# Patient Record
Sex: Female | Born: 1975 | Race: Black or African American | Hispanic: No | Marital: Single | Smoking: Current every day smoker
Health system: Southern US, Community
[De-identification: ages and names within clinical notes are randomized; demographics above are authoritative.]

## PROBLEM LIST (undated history)

## (undated) DIAGNOSIS — O009 Unspecified ectopic pregnancy without intrauterine pregnancy: Secondary | ICD-10-CM

## (undated) DIAGNOSIS — J40 Bronchitis, not specified as acute or chronic: Secondary | ICD-10-CM

## (undated) DIAGNOSIS — R011 Cardiac murmur, unspecified: Secondary | ICD-10-CM

## (undated) HISTORY — PX: OTHER SURGICAL HISTORY: SHX169

## (undated) HISTORY — PX: MOLE REMOVAL: SHX2046

---

## 1997-08-12 ENCOUNTER — Inpatient Hospital Stay (HOSPITAL_COMMUNITY): Admission: AD | Admit: 1997-08-12 | Discharge: 1997-08-12 | Payer: Self-pay | Admitting: Obstetrics

## 1997-08-16 ENCOUNTER — Ambulatory Visit (HOSPITAL_COMMUNITY): Admission: AD | Admit: 1997-08-16 | Discharge: 1997-08-16 | Payer: Self-pay | Admitting: Obstetrics

## 1997-08-19 ENCOUNTER — Ambulatory Visit (HOSPITAL_COMMUNITY): Admission: RE | Admit: 1997-08-19 | Discharge: 1997-08-19 | Payer: Self-pay | Admitting: Obstetrics

## 1997-09-29 ENCOUNTER — Inpatient Hospital Stay (HOSPITAL_COMMUNITY): Admission: AD | Admit: 1997-09-29 | Discharge: 1997-09-29 | Payer: Self-pay | Admitting: Obstetrics

## 1997-10-04 ENCOUNTER — Other Ambulatory Visit: Admission: RE | Admit: 1997-10-04 | Discharge: 1997-10-04 | Payer: Self-pay | Admitting: Obstetrics

## 1997-10-06 ENCOUNTER — Ambulatory Visit (HOSPITAL_COMMUNITY): Admission: RE | Admit: 1997-10-06 | Discharge: 1997-10-06 | Payer: Self-pay | Admitting: Obstetrics

## 1997-10-18 ENCOUNTER — Ambulatory Visit (HOSPITAL_COMMUNITY): Admission: RE | Admit: 1997-10-18 | Discharge: 1997-10-18 | Payer: Self-pay | Admitting: Obstetrics

## 1997-11-03 ENCOUNTER — Other Ambulatory Visit: Admission: RE | Admit: 1997-11-03 | Discharge: 1997-11-03 | Payer: Self-pay | Admitting: Obstetrics

## 1997-12-15 ENCOUNTER — Ambulatory Visit (HOSPITAL_COMMUNITY): Admission: RE | Admit: 1997-12-15 | Discharge: 1997-12-15 | Payer: Self-pay | Admitting: Obstetrics

## 1998-01-12 ENCOUNTER — Inpatient Hospital Stay (HOSPITAL_COMMUNITY): Admission: AD | Admit: 1998-01-12 | Discharge: 1998-01-12 | Payer: Self-pay | Admitting: Obstetrics

## 1998-02-25 ENCOUNTER — Inpatient Hospital Stay (HOSPITAL_COMMUNITY): Admission: AD | Admit: 1998-02-25 | Discharge: 1998-02-25 | Payer: Self-pay | Admitting: Obstetrics

## 1998-03-21 ENCOUNTER — Observation Stay (HOSPITAL_COMMUNITY): Admission: AD | Admit: 1998-03-21 | Discharge: 1998-03-22 | Payer: Self-pay | Admitting: Obstetrics

## 1998-03-25 ENCOUNTER — Encounter (HOSPITAL_COMMUNITY): Admission: RE | Admit: 1998-03-25 | Discharge: 1998-03-28 | Payer: Self-pay | Admitting: Obstetrics

## 1998-03-25 ENCOUNTER — Inpatient Hospital Stay (HOSPITAL_COMMUNITY): Admission: AD | Admit: 1998-03-25 | Discharge: 1998-03-25 | Payer: Self-pay | Admitting: Obstetrics

## 1998-03-26 ENCOUNTER — Inpatient Hospital Stay (HOSPITAL_COMMUNITY): Admission: AD | Admit: 1998-03-26 | Discharge: 1998-03-28 | Payer: Self-pay | Admitting: Obstetrics

## 1998-05-11 ENCOUNTER — Ambulatory Visit (HOSPITAL_COMMUNITY): Admission: RE | Admit: 1998-05-11 | Discharge: 1998-05-11 | Payer: Self-pay | Admitting: Obstetrics

## 1999-02-06 ENCOUNTER — Encounter: Payer: Self-pay | Admitting: Family Medicine

## 1999-02-06 ENCOUNTER — Emergency Department (HOSPITAL_COMMUNITY): Admission: EM | Admit: 1999-02-06 | Discharge: 1999-02-06 | Payer: Self-pay | Admitting: Emergency Medicine

## 1999-02-10 ENCOUNTER — Emergency Department (HOSPITAL_COMMUNITY): Admission: EM | Admit: 1999-02-10 | Discharge: 1999-02-10 | Payer: Self-pay | Admitting: Emergency Medicine

## 1999-05-02 ENCOUNTER — Emergency Department (HOSPITAL_COMMUNITY): Admission: EM | Admit: 1999-05-02 | Discharge: 1999-05-03 | Payer: Self-pay | Admitting: Emergency Medicine

## 1999-08-07 ENCOUNTER — Inpatient Hospital Stay (HOSPITAL_COMMUNITY): Admission: EM | Admit: 1999-08-07 | Discharge: 1999-08-07 | Payer: Self-pay | Admitting: Obstetrics

## 1999-08-11 ENCOUNTER — Inpatient Hospital Stay (HOSPITAL_COMMUNITY): Admission: AD | Admit: 1999-08-11 | Discharge: 1999-08-13 | Payer: Self-pay | Admitting: Obstetrics

## 1999-08-11 ENCOUNTER — Encounter: Payer: Self-pay | Admitting: Obstetrics

## 1999-08-11 ENCOUNTER — Encounter (INDEPENDENT_AMBULATORY_CARE_PROVIDER_SITE_OTHER): Payer: Self-pay

## 1999-09-06 ENCOUNTER — Other Ambulatory Visit: Admission: RE | Admit: 1999-09-06 | Discharge: 1999-09-06 | Payer: Self-pay | Admitting: Obstetrics

## 1999-10-18 ENCOUNTER — Inpatient Hospital Stay (HOSPITAL_COMMUNITY): Admission: AD | Admit: 1999-10-18 | Discharge: 1999-10-18 | Payer: Self-pay | Admitting: Obstetrics

## 1999-11-11 ENCOUNTER — Inpatient Hospital Stay (HOSPITAL_COMMUNITY): Admission: AD | Admit: 1999-11-11 | Discharge: 1999-11-11 | Payer: Self-pay | Admitting: Obstetrics

## 2000-01-16 ENCOUNTER — Inpatient Hospital Stay (HOSPITAL_COMMUNITY): Admission: AD | Admit: 2000-01-16 | Discharge: 2000-01-16 | Payer: Self-pay | Admitting: Obstetrics

## 2001-02-09 ENCOUNTER — Encounter: Payer: Self-pay | Admitting: Emergency Medicine

## 2001-02-09 ENCOUNTER — Emergency Department (HOSPITAL_COMMUNITY): Admission: EM | Admit: 2001-02-09 | Discharge: 2001-02-09 | Payer: Self-pay | Admitting: Emergency Medicine

## 2001-02-21 ENCOUNTER — Emergency Department (HOSPITAL_COMMUNITY): Admission: EM | Admit: 2001-02-21 | Discharge: 2001-02-22 | Payer: Self-pay | Admitting: Emergency Medicine

## 2001-08-11 ENCOUNTER — Inpatient Hospital Stay (HOSPITAL_COMMUNITY): Admission: AD | Admit: 2001-08-11 | Discharge: 2001-08-11 | Payer: Self-pay | Admitting: Obstetrics

## 2001-09-26 ENCOUNTER — Encounter: Payer: Self-pay | Admitting: Emergency Medicine

## 2001-09-26 ENCOUNTER — Emergency Department (HOSPITAL_COMMUNITY): Admission: EM | Admit: 2001-09-26 | Discharge: 2001-09-26 | Payer: Self-pay | Admitting: Emergency Medicine

## 2001-10-30 ENCOUNTER — Other Ambulatory Visit: Admission: RE | Admit: 2001-10-30 | Discharge: 2001-10-30 | Payer: Self-pay | Admitting: Obstetrics and Gynecology

## 2002-07-04 ENCOUNTER — Emergency Department (HOSPITAL_COMMUNITY): Admission: EM | Admit: 2002-07-04 | Discharge: 2002-07-04 | Payer: Self-pay | Admitting: *Deleted

## 2002-07-05 ENCOUNTER — Emergency Department (HOSPITAL_COMMUNITY): Admission: EM | Admit: 2002-07-05 | Discharge: 2002-07-05 | Payer: Self-pay | Admitting: Emergency Medicine

## 2003-05-11 ENCOUNTER — Emergency Department (HOSPITAL_COMMUNITY): Admission: EM | Admit: 2003-05-11 | Discharge: 2003-05-11 | Payer: Self-pay | Admitting: *Deleted

## 2005-09-14 ENCOUNTER — Emergency Department (HOSPITAL_COMMUNITY): Admission: EM | Admit: 2005-09-14 | Discharge: 2005-09-14 | Payer: Self-pay | Admitting: Emergency Medicine

## 2005-09-26 ENCOUNTER — Other Ambulatory Visit (HOSPITAL_COMMUNITY): Admission: RE | Admit: 2005-09-26 | Discharge: 2005-10-26 | Payer: Self-pay | Admitting: Psychiatry

## 2005-09-26 ENCOUNTER — Ambulatory Visit: Payer: Self-pay | Admitting: Psychiatry

## 2005-11-13 ENCOUNTER — Emergency Department (HOSPITAL_COMMUNITY): Admission: EM | Admit: 2005-11-13 | Discharge: 2005-11-13 | Payer: Self-pay | Admitting: Emergency Medicine

## 2006-02-18 ENCOUNTER — Inpatient Hospital Stay (HOSPITAL_COMMUNITY): Admission: AD | Admit: 2006-02-18 | Discharge: 2006-02-18 | Payer: Self-pay | Admitting: Obstetrics

## 2006-05-16 ENCOUNTER — Emergency Department (HOSPITAL_COMMUNITY): Admission: EM | Admit: 2006-05-16 | Discharge: 2006-05-16 | Payer: Self-pay | Admitting: Family Medicine

## 2007-12-15 ENCOUNTER — Emergency Department (HOSPITAL_COMMUNITY): Admission: EM | Admit: 2007-12-15 | Discharge: 2007-12-15 | Payer: Self-pay | Admitting: Family Medicine

## 2007-12-16 ENCOUNTER — Emergency Department (HOSPITAL_COMMUNITY): Admission: EM | Admit: 2007-12-16 | Discharge: 2007-12-17 | Payer: Self-pay | Admitting: Emergency Medicine

## 2008-08-02 ENCOUNTER — Emergency Department (HOSPITAL_COMMUNITY): Admission: EM | Admit: 2008-08-02 | Discharge: 2008-08-02 | Payer: Self-pay | Admitting: Emergency Medicine

## 2009-01-28 ENCOUNTER — Emergency Department (HOSPITAL_COMMUNITY): Admission: EM | Admit: 2009-01-28 | Discharge: 2009-01-28 | Payer: Self-pay | Admitting: Emergency Medicine

## 2009-02-23 ENCOUNTER — Emergency Department (HOSPITAL_COMMUNITY): Admission: EM | Admit: 2009-02-23 | Discharge: 2009-02-23 | Payer: Self-pay | Admitting: Family Medicine

## 2009-04-06 ENCOUNTER — Emergency Department (HOSPITAL_COMMUNITY): Admission: EM | Admit: 2009-04-06 | Discharge: 2009-04-06 | Payer: Self-pay | Admitting: Family Medicine

## 2010-08-09 LAB — POCT PREGNANCY, URINE: Preg Test, Ur: NEGATIVE

## 2010-09-15 NOTE — Discharge Summary (Signed)
[facility]  Patient:    Kristina Calhoun, Kristina Calhoun                     MRN: [id number] Adm. Date:  [id number] Disc. Date: [id number] Attending:  Venita Sheffield CC:         Kathreen Cosier, M.D.   Discharge Summary  ADMISSION DIAGNOSES:          1. Ruptured right tubal ectopic pregnancy.                               2. Hemoperitoneum.  DISCHARGE DIAGNOSIS:          Status post right salpingectomy for right                               tubal ectopic pregnancy.  CONDITION ON DISCHARGE:       Improved and discharged home in good condition.  REASON FOR ADMISSION:         A 35 year old G52, P4 black female with last menstrual period of June 20, 1999 and estimated gestational age of seven weeks.  Contraception is tubal ligation in January of 2000.  She presented to triage with severe right lower abdominal pain. The patient was seen in triage on August 08, 1999, with a three-day history of right lower abdominal pain and had a positive pregnancy test at that time.  She also had a urinary tract infection and was treated for the urinary tract infection and discharged home with ectopic precautions to follow up with Kathreen Cosier, M.D., in the office as soon as possible.  She did not follow instructions and now presents with severe right lower quadrant pain and ultrasound was done which revealed right tubal ectopic pregnancy with cardiac activity, large hemoperitoneum.  PAST SURGICAL HISTORY:        Tubal ligation in January of 2000.  PAST MEDICAL HISTORY:         None.  MEDICATIONS:                  Macrobid for urinary tract infection.  ALLERGIES:                    SULFA.  SOCIAL HISTORY:               Single, smokes six cigarettes per day.  Negative for alcohol or recreational drug use.  FAMILY HISTORY:               Noncontributory.  REVIEW OF SYSTEMS:            Positive per history of present illness, otherwise  noncontributory.  PHYSICAL EXAMINATION:  GENERAL APPEARANCE:           A well-nourished, well-developed black female in no acute distress.  VITAL SIGNS:                  Temperature 99, pulse 72, respiratory rate 22, blood pressure 111/51.  HEENT:                        Normal.  LUNGS:                        Clear to auscultation bilaterally.  CARDIOVASCULAR:  Regular rate and rhythm without murmurs, rubs, or gallops.  ABDOMEN:                      Soft, tender in the right lower quadrant.  No masses or organomegaly appreciated.  PELVIC:                       Uterus slightly enlarged, mid position, soft, nontender.  Right adnexal fullness appreciated with tenderness to deep palpation.  EXTREMITIES:                  There is no clubbing, cyanosis, or edema.  LABORATORY VALUES:            Hemoglobin 10.  Quantitative Beta HCG of 17,293.  IMPRESSION:                   Right tubal ectopic pregnancy.  PLAN:                         Surgical removal of right tubal ectopic pregnancy.  HOSPITAL COURSE:              The patient underwent a right salpingectomy on August 12, 1999, for a ruptured right tubal ectopic pregnancy without complications.  Postoperative course was uncomplicated and she was discharged home on postoperative day #2 in good condition.  DISCHARGE LABORATORY VALUES:  Hemoglobin 9.3, hematocrit 27.9, white blood cell count 8400, platelets 223,000.  Blood type was B positive.  MEDICATIONS:                  1. Percocet one to two tablets p.o. every                                  six hours as needed for pain.                               2. Ferrous sulfate 325 mg p.o. four times a                                  day.  DISPOSITION:                  Activity, diet and wound care instructions were given per written booklet.  FOLLOW-UP:                    The patient is to call Dr. Tawny Hopping office for a follow-up appointment in four to six  weeks. DD:  02/06/00 TD:  02/07/00 Job: [id number]

## 2010-09-15 NOTE — Op Note (Signed)
[facility]  Patient:    Kristina Calhoun, Kristina Calhoun                     MRN: [id number] Proc. Date: 08/12/99 Adm. Date:  [id number] Attending:  Tammi Sou CC:         Kathreen Cosier, M.D.                           Operative Report  PREOPERATIVE DIAGNOSES:       Ruptured right tubal ectopic pregnancy, positive cardiac activity, large hemoperitoneum.  POSTOPERATIVE DIAGNOSES:      Ruptured right tubal ectopic pregnancy, positive cardiac activity, large hemoperitoneum.  PROCEDURE:                    Right salpingectomy.  SURGEON:                      Charles A. Clearance Coots, M.D.  SURGEON:                      Charles A. Clearance Coots, M.D.  ASSISTANT:                    Placido Sou, R.N.  ANESTHESIA:                   General.  ESTIMATED BLOOD LOSS:         200 ml with hemoperitoneum and clot.  COMPLICATIONS:                None.  DRAINS:                       Foley to gravity.  SPECIMEN:                     Right fallopian tube with ectopic pregnancy.  DESCRIPTION OF PROCEDURE:     The patient was brought to the operating room and  after satisfactory general endotracheal intubation, the abdomen was prepped and  draped in the usual sterile fashion in a supine position.  A small Pfannenstiel  incision was made with the scalpel that was deepened down to the fascia with the scalpel.  Fascia was nicked in the midline and the fascial incision was extended to the left and to the right with curved Mayo scissors.  The superior and inferior  fascial edges were taken off of the rectus muscle with both blunt and sharp dissection.  The rectus muscle was bluntly and sharply divided in the midline.  Peritoneum was entered digitally and was digitally extended to the left and to he right.  A large amount of hemoperitoneum was noted and this was suctioned for better visibility of the uterus and the ectopic pregnancy.  The right adnexal area was then  grasped with [facility] forcep and the uterus was elevated.  The right fallopian tube was grasped with a Babcock clamp and was followed serially with  Babcock clamp down to the ectopic pregnancy.  Kelly forceps were then placed across the proximal end of the fallopian tube, extending beneath the ectopic pregnancy  with a double-clamp technique from the proximal end of the tube and the distal nd of the tube, with the two Kelly forceps abutting each other beneath the ectopic  pregnancy.  The right fallopian tube was then excised with Metzenbaum scissors nd the stumps were suture-ligated with  transfixion sutures of 2-0 Monocryl.  A free tie of 2-0 Monocryl was then placed below the knot.  Hemostasis was excellent and the proximal tubal stump was placed back in its normal anatomic position. Pelvic cavity was thoroughly irrigated with warm saline solution and all clots were removed.  The abdomen was then closed as follows:  The fascia was closed with a  continuous suture of 2-0 Vicryl, subcutaneous tissue was thoroughly irrigated with warm saline solution and all areas of subcutaneous bleeding were coagulated with the Bovie.  The skin was then approximated with a subcuticular suture of 3-0 Monocryl.  A sterile bandage was applied to the incision closure.  Surgical technician indicated that all needle, sponge and instrument counts were correct. The patient tolerated the procedure well and was transported to the recovery room in satisfactory condition.DD:  08/12/99 TD:  08/12/99 Job: [id number]

## 2011-10-28 ENCOUNTER — Encounter (HOSPITAL_COMMUNITY): Payer: Self-pay | Admitting: Emergency Medicine

## 2011-10-28 ENCOUNTER — Emergency Department (HOSPITAL_COMMUNITY): Payer: Self-pay

## 2011-10-28 ENCOUNTER — Emergency Department (HOSPITAL_COMMUNITY)
Admission: EM | Admit: 2011-10-28 | Discharge: 2011-10-28 | Disposition: A | Discharge status: Home / Self Care | Payer: Self-pay | Attending: Emergency Medicine | Admitting: Emergency Medicine

## 2011-10-28 DIAGNOSIS — J40 Bronchitis, not specified as acute or chronic: Secondary | ICD-10-CM | POA: Insufficient documentation

## 2011-10-28 DIAGNOSIS — Z79899 Other long term (current) drug therapy: Secondary | ICD-10-CM | POA: Insufficient documentation

## 2011-10-28 DIAGNOSIS — Z87891 Personal history of nicotine dependence: Secondary | ICD-10-CM | POA: Insufficient documentation

## 2011-10-28 DIAGNOSIS — Z7982 Long term (current) use of aspirin: Secondary | ICD-10-CM | POA: Insufficient documentation

## 2011-10-28 HISTORY — DX: Bronchitis, not specified as acute or chronic: J40

## 2011-10-28 HISTORY — DX: Unspecified ectopic pregnancy without intrauterine pregnancy: O00.90

## 2011-10-28 MED ORDER — IPRATROPIUM BROMIDE 0.02 % IN SOLN
0.5000 mg | Freq: Once | RESPIRATORY_TRACT | Status: AC
  Administered 2011-10-28: 0.5 mg via RESPIRATORY_TRACT
  Filled 2011-10-28: qty 2.5

## 2011-10-28 MED ORDER — PREDNISONE 20 MG PO TABS
ORAL_TABLET | ORAL | Status: AC
  Administered 2011-10-28: 60 mg
  Filled 2011-10-28: qty 3

## 2011-10-28 MED ORDER — PREDNISONE 20 MG PO TABS
ORAL_TABLET | ORAL | Status: AC
  Filled 2011-10-28: qty 3

## 2011-10-28 MED ORDER — ALBUTEROL SULFATE (5 MG/ML) 0.5% IN NEBU
5.0000 mg | INHALATION_SOLUTION | Freq: Once | RESPIRATORY_TRACT | Status: AC
  Administered 2011-10-28: 5 mg via RESPIRATORY_TRACT
  Filled 2011-10-28: qty 1

## 2011-10-28 MED ORDER — ONDANSETRON 4 MG PO TBDP: 8.0000 mg | ORAL_TABLET | Freq: Once | ORAL | Status: DC

## 2011-10-28 MED ORDER — ONDANSETRON HCL 8 MG PO TABS
ORAL_TABLET | ORAL | Status: AC
  Administered 2011-10-28: 8 mg
  Filled 2011-10-28: qty 2

## 2011-10-28 MED ORDER — PREDNISONE 50 MG PO TABS: ORAL_TABLET | ORAL | Status: DC

## 2011-10-28 MED ORDER — HYDROCOD POLST-CHLORPHEN POLST 10-8 MG/5ML PO LQCR: 5.0000 mL | Freq: Two times a day (BID) | ORAL | Status: DC

## 2011-10-28 MED ORDER — PREDNISONE 20 MG PO TABS
60.0000 mg | ORAL_TABLET | Freq: Once | ORAL | Status: AC
  Administered 2011-10-28: 60 mg via ORAL
  Filled 2011-10-28: qty 3

## 2011-10-28 MED ORDER — ALBUTEROL SULFATE HFA 108 (90 BASE) MCG/ACT IN AERS: 1.0000 | INHALATION_SPRAY | Freq: Four times a day (QID) | RESPIRATORY_TRACT | Status: DC | PRN

## 2011-10-28 NOTE — Discharge Instructions (Signed)
Take prednisone every morning for the next 5 days using albuterol inhaler as needed for cough. Use cough syrupr for additional cough relief but do not drive or operate machinery with use. Followup with your primary care provider or the urgent care in the next few days for recheck of ongoing symptoms however return to the emergency room at any time for emergent changing or worsening symptoms.  Bronchitis Bronchitis is the body's way of reacting to injury and/or infection (inflammation) of the bronchi. Bronchi are the air tubes that extend from the windpipe into the lungs. If the inflammation becomes severe, it may cause shortness of breath. CAUSES  Inflammation may be caused by:  A virus.   Germs (bacteria).   Dust.   Allergens.   Pollutants and many other irritants.  The cells lining the bronchial tree are covered with tiny hairs (cilia). These constantly beat upward, away from the lungs, toward the mouth. This keeps the lungs free of pollutants. When these cells become too irritated and are unable to do their job, mucus begins to develop. This causes the characteristic cough of bronchitis. The cough clears the lungs when the cilia are unable to do their job. Without either of these protective mechanisms, the mucus would settle in the lungs. Then you would develop pneumonia. Smoking is a common cause of bronchitis and can contribute to pneumonia. Stopping this habit is the single most important thing you can do to help yourself. TREATMENT   Your caregiver may prescribe an antibiotic if the cough is caused by bacteria. Also, medicines that open up your airways make it easier to breathe. Your caregiver may also recommend or prescribe an expectorant. It will loosen the mucus to be coughed up. Only take over-the-counter or prescription medicines for pain, discomfort, or fever as directed by your caregiver.   Removing whatever causes the problem (smoking, for example) is critical to preventing the  problem from getting worse.   Cough suppressants may be prescribed for relief of cough symptoms.   Inhaled medicines may be prescribed to help with symptoms now and to help prevent problems from returning.   For those with recurrent (chronic) bronchitis, there may be a need for steroid medicines.  SEEK IMMEDIATE MEDICAL CARE IF:   During treatment, you develop more pus-like mucus (purulent sputum).   You have a fever.   Your baby is older than 3 months with a rectal temperature of 102 F (38.9 C) or higher.   Your baby is 66 months old or younger with a rectal temperature of 100.4 F (38 C) or higher.   You become progressively more ill.   You have increased difficulty breathing, wheezing, or shortness of breath.  It is necessary to seek immediate medical care if you are elderly or sick from any other disease. MAKE SURE YOU:   Understand these instructions.   Will watch your condition.   Will get help right away if you are not doing well or get worse.  Document Released: 04/16/2005 Document Revised: 04/05/2011 Document Reviewed: 02/24/2008 [facility] Patient Information 2012 [place], [place].

## 2011-10-28 NOTE — ED Provider Notes (Signed)
Medical screening examination/treatment/procedure(s) were performed by non-physician practitioner and as supervising physician I was immediately available for consultation/collaboration.   Glynn Octave, MD 10/28/11 [phone]

## 2011-10-28 NOTE — ED Notes (Signed)
Pt given Prednisone again once nausea subsided.  Tolerated well.  Pt d/c'd.

## 2011-10-28 NOTE — ED Notes (Signed)
Pt reports head cold with productive cough onset Thursday. Pt reports cough no longer productive. Pt noted to have dry cough in triage. Pt c/o shortness of breath and fatigue. Pt denies fever.

## 2011-10-28 NOTE — ED Provider Notes (Signed)
History     CSN: [id number]  Arrival date & time 10/28/11  8295   First MD Initiated Contact with Patient 10/28/11 1018      Chief Complaint  Patient presents with  . Cough    (Consider location/radiation/quality/duration/timing/severity/associated sxs/prior treatment) HPI  Patient presents to emergency department complaining of bronchitis. Patient states she has history of recurrent bronchitis stating that she stopped using traditional cigarettes 6 months ago and over the last 6 months has been using an electronic cigarettes. Patient states that over the last 4 days she's had cough and cold like symptoms with a productive cough for the first few days that has now become nonproductive cough. Patient states that she also had nasal congestion and some drainage that have improved however ongoing cough. She denies any fevers, chills, chest pain, hemoptysis, abdominal pain, nausea, vomiting, diarrhea, sore throat, difficulty swallowing, or difficulty breathing. Patient states her cough is consistent with her history of recurrent bronchitis. Patient taken nothing for cough prior to arrival. Patient denies aggravating or alleviating factors.  Past Medical History  Diagnosis Date  . Bronchitis   . Ectopic pregnancy     Past Surgical History  Procedure Date  . Etopic pregnancy ectopic surgery    History reviewed. No pertinent family history.  History  Substance Use Topics  . Smoking status: Former Smoker    Types: Cigarettes    Quit date: 08/28/2011  . Smokeless tobacco: Not on file   Comment: uses E cigarettes  . Alcohol Use: No    OB History    Grav Para Term Preterm Abortions TAB SAB Ect Mult Living                  Review of Systems  All other systems reviewed and are negative.    Allergies  Sulfa antibiotics  Home Medications   Current Outpatient Rx  Name Route Sig Dispense Refill  . ASPIRIN-ACETAMINOPHEN-CAFFEINE 250-250-65 MG PO TABS Oral Take 2 tablets by  mouth every 6 (six) hours as needed. For pain/headache    . ALBUTEROL SULFATE HFA 108 (90 BASE) MCG/ACT IN AERS Inhalation Inhale 1-2 puffs into the lungs every 6 (six) hours as needed for wheezing. 1 Inhaler 0  . HYDROCOD POLST-CPM POLST ER 10-8 MG/5ML PO LQCR Oral Take 5 mLs by mouth every 12 (twelve) hours. 115 mL 0  . PREDNISONE 50 MG PO TABS  Take 1 tablet by mouth daily for total of 5 days. 5 tablet 0    BP 127/78  Pulse 103  Temp 98.7 F (37.1 C) (Oral)  Resp 19  SpO2 97%  LMP 10/08/2011  Physical Exam  Vitals reviewed. Constitutional: She is oriented to person, place, and time. She appears well-developed and well-nourished. No distress.  HENT:  Head: Normocephalic and atraumatic.  Right Ear: External ear normal.  Left Ear: External ear normal.  Nose: Nose normal.  Mouth/Throat: No oropharyngeal exudate.       Mild erythema of posterior pharynx and tonsils no tonsillar exudate or enlargement. Patent airway. Swallowing secretions well  Eyes: Conjunctivae are normal.  Neck: Normal range of motion. Neck supple.  Cardiovascular: Normal rate, regular rhythm and normal heart sounds.  Exam reveals no gallop and no friction rub.   No murmur heard. Pulmonary/Chest: Effort normal. No respiratory distress. She has wheezes. She has no rales. She exhibits no tenderness.       Faint exp wheezing bilaterally with good air movement throughout lung fields.   Abdominal: Soft. Bowel sounds are  normal. She exhibits no distension and no mass. There is no tenderness. There is no rebound and no guarding.  Lymphadenopathy:    She has no cervical adenopathy.  Neurological: She is alert and oriented to person, place, and time. She has normal reflexes.  Skin: Skin is warm and dry. No rash noted. She is not diaphoretic.  Psychiatric: She has a normal mood and affect.    ED Course  Procedures (including critical care time)  Neb albuterol/atrovent. PO prednisone.   Labs Reviewed - No data to  display Dg Chest 2 View  10/28/2011  *RADIOLOGY REPORT*  Clinical Data: Cough  CHEST - 2 VIEW  Comparison: 08/02/2008  Findings: The heart size and mediastinal contours are within normal limits. Chronic bronchitic changes appears similar to previous exam.  Both lungs are clear.  The visualized skeletal structures are unremarkable.  IMPRESSION: No acute cardiopulmonary abnormalities.  Original Report Authenticated By: Rosealee Albee, M.D.     1. Bronchitis       MDM  Patient is afebrile and nontoxic-appearing with no respiratory distress. No acute findings on chest x-ray. Patient's current symptoms are consistent with her prior bronchitis. Will treat with as needed albuterol inhaler and steroids. Gave patient referral to primary care.        [place], [place] 10/28/11 1027

## 2011-10-28 NOTE — ED Notes (Signed)
Pt vomiting at the time. Given medication, will attempt to take PO steroids after nausea stopped.

## 2011-11-08 ENCOUNTER — Emergency Department (HOSPITAL_COMMUNITY)
Admission: EM | Admit: 2011-11-08 | Discharge: 2011-11-08 | Disposition: A | Discharge status: Home / Self Care | Payer: Self-pay | Attending: Emergency Medicine | Admitting: Emergency Medicine

## 2011-11-08 ENCOUNTER — Encounter (HOSPITAL_COMMUNITY): Payer: Self-pay | Admitting: Emergency Medicine

## 2011-11-08 DIAGNOSIS — J040 Acute laryngitis: Secondary | ICD-10-CM | POA: Insufficient documentation

## 2011-11-08 DIAGNOSIS — R0602 Shortness of breath: Secondary | ICD-10-CM | POA: Insufficient documentation

## 2011-11-08 DIAGNOSIS — R0789 Other chest pain: Secondary | ICD-10-CM | POA: Insufficient documentation

## 2011-11-08 DIAGNOSIS — H9209 Otalgia, unspecified ear: Secondary | ICD-10-CM | POA: Insufficient documentation

## 2011-11-08 DIAGNOSIS — J3489 Other specified disorders of nose and nasal sinuses: Secondary | ICD-10-CM | POA: Insufficient documentation

## 2011-11-08 DIAGNOSIS — R509 Fever, unspecified: Secondary | ICD-10-CM | POA: Insufficient documentation

## 2011-11-08 MED ORDER — DEXAMETHASONE SODIUM PHOSPHATE 10 MG/ML IJ SOLN: 8.0000 mg | Freq: Once | INTRAMUSCULAR | Status: DC

## 2011-11-08 MED ORDER — GUAIFENESIN ER 1200 MG PO TB12: 1.0000 | ORAL_TABLET | Freq: Two times a day (BID) | ORAL | Status: DC

## 2011-11-08 MED ORDER — DEXAMETHASONE SODIUM PHOSPHATE 4 MG/ML IJ SOLN
INTRAMUSCULAR | Status: AC
  Administered 2011-11-08: 8 mg
  Filled 2011-11-08: qty 2

## 2011-11-08 MED ORDER — PREDNISONE 50 MG PO TABS: 50.0000 mg | ORAL_TABLET | Freq: Every day | ORAL | Status: AC

## 2011-11-08 MED ORDER — DEXAMETHASONE SODIUM PHOSPHATE 10 MG/ML IJ SOLN: 10.0000 mg | Freq: Once | INTRAMUSCULAR | Status: DC

## 2011-11-08 NOTE — ED Provider Notes (Signed)
History     CSN: [id number]  Arrival date & time 11/08/11  2029   First MD Initiated Contact with Patient 11/08/11 2052      Chief Complaint  Patient presents with  . Laryngitis    (Consider location/radiation/quality/duration/timing/severity/associated sxs/prior treatment) HPI Patient presents emergency Dept. with hoarseness of her voice that began 2 days ago.  Patient, states last week she was dealing with bronchitis.  Patient has fevers, chest pain, shortness of breath, sore throat, nasal congestion, ear pain nausea, vomiting or weakness. The patient states that her voice get worse as the day goes on. Past Medical History  Diagnosis Date  . Bronchitis   . Ectopic pregnancy     Past Surgical History  Procedure Date  . Etopic pregnancy ectopic surgery    History reviewed. No pertinent family history.  History  Substance Use Topics  . Smoking status: Former Smoker    Types: Cigarettes    Quit date: 08/28/2011  . Smokeless tobacco: Not on file   Comment: uses E cigarettes  . Alcohol Use: No    OB History    Grav Para Term Preterm Abortions TAB SAB Ect Mult Living                  Review of Systems All other systems negative except as documented in the HPI. All pertinent positives and negatives as reviewed in the HPI.  Allergies  Sulfa antibiotics  Home Medications   Current Outpatient Rx  Name Route Sig Dispense Refill  . ALBUTEROL SULFATE HFA 108 (90 BASE) MCG/ACT IN AERS Inhalation Inhale 1-2 puffs into the lungs every 6 (six) hours as needed for wheezing. 1 Inhaler 0  . HYDROCOD POLST-CPM POLST ER 10-8 MG/5ML PO LQCR Oral Take 5 mLs by mouth daily as needed. For cough/pain      BP 153/88  Temp 98.3 F (36.8 C) (Oral)  Resp 18  SpO2 98%  LMP 11/05/2011  Physical Exam  Nursing note and vitals reviewed. Constitutional: She appears well-developed and well-nourished.  HENT:  Head: Normocephalic and atraumatic.  Mouth/Throat: Oropharynx is clear and  moist. No oropharyngeal exudate.  Eyes: Pupils are equal, round, and reactive to light.  Cardiovascular: Normal rate and regular rhythm.   Pulmonary/Chest: Effort normal and breath sounds normal.  Skin: Skin is warm and dry. No rash noted.    ED Course  Procedures (including critical care time)   The patient will be treated for laryngitis. The patient is advised to return here as needed. Follow up with your doctor.  MDM          Carlyle Dolly, PA-C 11/08/11 2133

## 2011-11-08 NOTE — ED Provider Notes (Signed)
Medical screening examination/treatment/procedure(s) were performed by non-physician practitioner and as supervising physician I was immediately available for consultation/collaboration.   Dione Booze, MD 11/08/11 [phone]

## 2011-11-08 NOTE — ED Notes (Signed)
Patient seen last week with bronchitis, now with scratchy throat and loosing voice.

## 2012-05-04 ENCOUNTER — Emergency Department (HOSPITAL_COMMUNITY): Payer: BC Managed Care – PPO

## 2012-05-04 ENCOUNTER — Emergency Department (HOSPITAL_COMMUNITY)
Admission: EM | Admit: 2012-05-04 | Discharge: 2012-05-04 | Disposition: A | Discharge status: Home / Self Care | Payer: BC Managed Care – PPO | Attending: Emergency Medicine | Admitting: Emergency Medicine

## 2012-05-04 ENCOUNTER — Encounter (HOSPITAL_COMMUNITY): Payer: Self-pay | Admitting: Emergency Medicine

## 2012-05-04 DIAGNOSIS — Z79899 Other long term (current) drug therapy: Secondary | ICD-10-CM | POA: Insufficient documentation

## 2012-05-04 DIAGNOSIS — Z87891 Personal history of nicotine dependence: Secondary | ICD-10-CM | POA: Insufficient documentation

## 2012-05-04 DIAGNOSIS — J40 Bronchitis, not specified as acute or chronic: Secondary | ICD-10-CM | POA: Insufficient documentation

## 2012-05-04 MED ORDER — PREDNISONE 10 MG PO TABS: 20.0000 mg | ORAL_TABLET | Freq: Every day | ORAL | Status: DC

## 2012-05-04 MED ORDER — HYDROCOD POLST-CHLORPHEN POLST 10-8 MG/5ML PO LQCR: 5.0000 mL | Freq: Two times a day (BID) | ORAL | Status: DC

## 2012-05-04 MED ORDER — PREDNISONE 20 MG PO TABS
60.0000 mg | ORAL_TABLET | Freq: Once | ORAL | Status: AC
  Administered 2012-05-04: 60 mg via ORAL
  Filled 2012-05-04: qty 3

## 2012-05-04 NOTE — ED Notes (Addendum)
C/o sore throat, non-productive cough, and nausea x 3-4 days.

## 2012-05-04 NOTE — ED Provider Notes (Signed)
History     CSN: [id number]  Arrival date & time 05/04/12  2123   First MD Initiated Contact with Patient 05/04/12 2133      Chief Complaint  Patient presents with  . Cough    (Consider location/radiation/quality/duration/timing/severity/associated sxs/prior treatment) HPI Comments: Patient with Hx bronchitis has had nonproductive cough for the past 4 days She has been using inhaler with no relief than she started taking Muconex with no relief.  Denies fever, smoking N/V/D Has no PCP   Patient is a 37 y.o. female presenting with cough. The history is provided by the patient.  Cough This is a new problem. The current episode started more than 2 days ago. The problem occurs every few minutes. The cough is non-productive. Pertinent negatives include no chills, no rhinorrhea, no sore throat, no myalgias, no shortness of breath and no wheezing.    Past Medical History  Diagnosis Date  . Bronchitis   . Ectopic pregnancy     Past Surgical History  Procedure Date  . Etopic pregnancy ectopic surgery    No family history on file.  History  Substance Use Topics  . Smoking status: Former Smoker    Types: Cigarettes    Quit date: 08/28/2011  . Smokeless tobacco: Not on file     Comment: uses E cigarettes  . Alcohol Use: No    OB History    Grav Para Term Preterm Abortions TAB SAB Ect Mult Living                  Review of Systems  Constitutional: Negative for fever and chills.  HENT: Negative for congestion, sore throat and rhinorrhea.   Respiratory: Positive for cough. Negative for shortness of breath and wheezing.   Gastrointestinal: Negative for nausea and vomiting.  Musculoskeletal: Negative for myalgias.  Skin: Negative for rash and wound.    Allergies  Sulfa antibiotics  Home Medications   Current Outpatient Rx  Name  Route  Sig  Dispense  Refill  . ALBUTEROL SULFATE HFA 108 (90 BASE) MCG/ACT IN AERS   Inhalation   Inhale 1-2 puffs into the lungs every 6  (six) hours as needed for wheezing.   1 Inhaler   0   . HYDROCOD POLST-CPM POLST ER 10-8 MG/5ML PO LQCR   Oral   Take 5 mLs by mouth every 12 (twelve) hours.   140 mL   0   . PREDNISONE 10 MG PO TABS   Oral   Take 2 tablets (20 mg total) by mouth daily.   14 tablet   0     BP 141/68  Temp 99.8 F (37.7 C) (Oral)  Resp 24  SpO2 98%  LMP 04/13/2012  Physical Exam  Constitutional: She appears well-developed and well-nourished.  HENT:  Head: Normocephalic.  Right Ear: External ear normal.  Left Ear: External ear normal.  Mouth/Throat: Oropharynx is clear and moist.  Eyes: Pupils are equal, round, and reactive to light.  Neck: Normal range of motion.  Cardiovascular: Normal rate.   Pulmonary/Chest: Effort normal and breath sounds normal. No respiratory distress. She has no wheezes.       Frequent coughing  Musculoskeletal: Normal range of motion.  Neurological: She is alert.  Skin: Skin is warm.    ED Course  Procedures (including critical care time)  Labs Reviewed - No data to display Dg Chest 2 View  05/04/2012  *RADIOLOGY REPORT*  Clinical Data: Cough with mild fever  CHEST - 2 VIEW  Comparison: 10/28/2011.  Findings: Normal heart size and clear lung fields.  No bony abnormality. No hilar or mediastinal contour abnormalities.  No change from priors.  IMPRESSION: Negative exam.   Original Report Authenticated By: Davonna Belling, M.D.      1. Bronchitis       MDM          Arman Filter, NP 05/05/12 [phone]

## 2012-05-07 NOTE — ED Provider Notes (Signed)
Medical screening examination/treatment/procedure(s) were performed by non-physician practitioner and as supervising physician I was immediately available for consultation/collaboration.   Yonael Tulloch, MD 05/07/12 0724 

## 2012-05-12 ENCOUNTER — Encounter (HOSPITAL_COMMUNITY): Payer: Self-pay

## 2012-05-12 ENCOUNTER — Emergency Department (INDEPENDENT_AMBULATORY_CARE_PROVIDER_SITE_OTHER)
Admission: EM | Admit: 2012-05-12 | Discharge: 2012-05-12 | Disposition: A | Discharge status: Home / Self Care | Payer: BC Managed Care – PPO | Source: Home / Self Care

## 2012-05-12 DIAGNOSIS — J040 Acute laryngitis: Secondary | ICD-10-CM

## 2012-05-12 DIAGNOSIS — J069 Acute upper respiratory infection, unspecified: Secondary | ICD-10-CM

## 2012-05-12 DIAGNOSIS — J9801 Acute bronchospasm: Secondary | ICD-10-CM

## 2012-05-12 MED ORDER — IPRATROPIUM BROMIDE 0.02 % IN SOLN
0.5000 mg | Freq: Once | RESPIRATORY_TRACT | Status: AC
  Administered 2012-05-12: 0.5 mg via RESPIRATORY_TRACT

## 2012-05-12 MED ORDER — ALBUTEROL SULFATE HFA 108 (90 BASE) MCG/ACT IN AERS: 2.0000 | INHALATION_SPRAY | RESPIRATORY_TRACT | Status: DC | PRN

## 2012-05-12 MED ORDER — TRIAMCINOLONE ACETONIDE 40 MG/ML IJ SUSP
60.0000 mg | Freq: Once | INTRAMUSCULAR | Status: AC
  Administered 2012-05-12: 60 mg via INTRAMUSCULAR

## 2012-05-12 MED ORDER — TRIAMCINOLONE ACETONIDE 40 MG/ML IJ SUSP
INTRAMUSCULAR | Status: AC
  Filled 2012-05-12: qty 5

## 2012-05-12 MED ORDER — ALBUTEROL SULFATE (5 MG/ML) 0.5% IN NEBU
INHALATION_SOLUTION | RESPIRATORY_TRACT | Status: AC
  Filled 2012-05-12: qty 1

## 2012-05-12 MED ORDER — ALBUTEROL SULFATE (5 MG/ML) 0.5% IN NEBU
5.0000 mg | INHALATION_SOLUTION | Freq: Once | RESPIRATORY_TRACT | Status: AC
  Administered 2012-05-12: 5 mg via RESPIRATORY_TRACT

## 2012-05-12 NOTE — ED Provider Notes (Signed)
History     CSN: [id number]  Arrival date & time 05/12/12  1156   None     Chief Complaint  Patient presents with  . Cough    (Consider location/radiation/quality/duration/timing/severity/associated sxs/prior treatment) HPI Comments: 37 year old female that was seen in emergency department a few days ago for laryngitis and bronchitis. She was treated with oral steroids and Tussionex. She returns today complaining of an improvement in her laryngitis with the inability to speak and her cough. She states that she does not have an albuterol inhaler. She denies fever, sore throat or earache.   Past Medical History  Diagnosis Date  . Bronchitis   . Ectopic pregnancy     Past Surgical History  Procedure Date  . Etopic pregnancy ectopic surgery    History reviewed. No pertinent family history.  History  Substance Use Topics  . Smoking status: Former Smoker    Types: Cigarettes    Quit date: 08/28/2011  . Smokeless tobacco: Not on file     Comment: uses E cigarettes  . Alcohol Use: No    OB History    Grav Para Term Preterm Abortions TAB SAB Ect Mult Living                  Review of Systems  Constitutional: Negative for fever, chills, activity change, appetite change and fatigue.  HENT: Positive for rhinorrhea, voice change and postnasal drip. Negative for sore throat, facial swelling, neck pain and neck stiffness.   Eyes: Negative.   Respiratory: Negative.   Cardiovascular: Negative.   Gastrointestinal: Negative.   Genitourinary: Negative.   Skin: Negative for pallor and rash.  Neurological: Negative.     Allergies  Sulfa antibiotics  Home Medications   Current Outpatient Rx  Name  Route  Sig  Dispense  Refill  . ALBUTEROL SULFATE HFA 108 (90 BASE) MCG/ACT IN AERS   Inhalation   Inhale 1-2 puffs into the lungs every 6 (six) hours as needed for wheezing.   1 Inhaler   0   . ALBUTEROL SULFATE HFA 108 (90 BASE) MCG/ACT IN AERS   Inhalation   Inhale 2  puffs into the lungs every 4 (four) hours as needed for wheezing.   1 Inhaler   0   . HYDROCOD POLST-CPM POLST ER 10-8 MG/5ML PO LQCR   Oral   Take 5 mLs by mouth every 12 (twelve) hours.   140 mL   0   . PREDNISONE 10 MG PO TABS   Oral   Take 2 tablets (20 mg total) by mouth daily.   14 tablet   0     BP 138/79  Pulse 74  Temp 98.2 F (36.8 C) (Oral)  Resp 22  SpO2 98%  LMP 04/13/2012  Physical Exam  Nursing note and vitals reviewed. Constitutional: She is oriented to person, place, and time. She appears well-developed and well-nourished. No distress.  HENT:       Bilateral TMs are normal Oropharynx with minor erythematous and clear PND. No exudates or other lesions.  Eyes: Conjunctivae normal and EOM are normal.  Neck: Normal range of motion. Neck supple.  Cardiovascular: Normal rate, regular rhythm and normal heart sounds.   Pulmonary/Chest: Effort normal. No respiratory distress. She has wheezes. She has no rales.       Expiratory phase is prolonged and associated with bilateral coarseness,  wheezes and  rhonchi.  Musculoskeletal: Normal range of motion. She exhibits no edema.  Lymphadenopathy:    She has  no cervical adenopathy.  Neurological: She is alert and oriented to person, place, and time.  Skin: Skin is warm and dry. No rash noted.  Psychiatric: She has a normal mood and affect.    ED Course  Procedures (including critical care time)  Labs Reviewed - No data to display No results found.   1. Laryngitis   2. Bronchospasm   3. URI (upper respiratory infection)       MDM  DuoNeb: Post neb with remarkable improvement in air movement and decrease in wheezing. She states she is feeling and breathing much better. Kenalog 60 mg IM Albuterol HFA 2 puffs every 4 hours when necessary cough and wheeze Vocal rest.  Claritin 10 mg a day to help diminish PND. The patient is discharged in improved and stable condition          Hayden Rasmussen,  NP 05/12/12 [phone]

## 2012-05-12 NOTE — ED Notes (Signed)
Cough, lost voice; seen ED 1-5, given Rx for syx, no better

## 2012-05-17 NOTE — ED Provider Notes (Signed)
Medical screening examination/treatment/procedure(s) were performed by resident physician or non-physician practitioner and as supervising physician I was immediately available for consultation/collaboration.   Marq Rebello DOUGLAS MD.    Gwenyth Dingee D Travas Schexnayder, MD 05/17/12 1725 

## 2013-09-17 ENCOUNTER — Encounter (HOSPITAL_COMMUNITY): Payer: Self-pay | Admitting: Emergency Medicine

## 2013-09-17 ENCOUNTER — Emergency Department (HOSPITAL_COMMUNITY)
Admission: EM | Admit: 2013-09-17 | Discharge: 2013-09-17 | Disposition: A | Discharge status: Home / Self Care | Payer: 59 | Source: Home / Self Care | Attending: Family Medicine | Admitting: Family Medicine

## 2013-09-17 DIAGNOSIS — J40 Bronchitis, not specified as acute or chronic: Secondary | ICD-10-CM

## 2013-09-17 MED ORDER — FLUTICASONE PROPIONATE 50 MCG/ACT NA SUSP: 2.0000 | Freq: Every day | NASAL | Status: DC

## 2013-09-17 MED ORDER — PREDNISONE 10 MG PO TABS: 30.0000 mg | ORAL_TABLET | Freq: Every day | ORAL | Status: DC

## 2013-09-17 MED ORDER — IPRATROPIUM-ALBUTEROL 0.5-2.5 (3) MG/3ML IN SOLN
3.0000 mL | Freq: Once | RESPIRATORY_TRACT | Status: AC
  Administered 2013-09-17: 3 mL via RESPIRATORY_TRACT

## 2013-09-17 MED ORDER — ALBUTEROL SULFATE HFA 108 (90 BASE) MCG/ACT IN AERS: 2.0000 | INHALATION_SPRAY | Freq: Four times a day (QID) | RESPIRATORY_TRACT | Status: DC | PRN

## 2013-09-17 MED ORDER — IPRATROPIUM-ALBUTEROL 0.5-2.5 (3) MG/3ML IN SOLN
RESPIRATORY_TRACT | Status: AC
  Filled 2013-09-17: qty 3

## 2013-09-17 NOTE — ED Provider Notes (Signed)
Kristina Calhoun is a 38 y.o. female who presents to Urgent Care today for cough congestion wheezing runny nose and fatigue.  Symptoms present for one day. Patient has tried Motrin which has helped a bit. No significant shortness of breath nausea vomiting or diarrhea. She feels well otherwise. Symptoms consistent with prior episodes of bronchitis.   Past Medical History  Diagnosis Date  . Bronchitis   . Ectopic pregnancy    History  Substance Use Topics  . Smoking status: Former Smoker    Types: Cigarettes    Quit date: 08/28/2011  . Smokeless tobacco: Not on file     Comment: uses E cigarettes  . Alcohol Use: No   ROS as above Medications: No current facility-administered medications for this encounter.   Current Outpatient Prescriptions  Medication Sig Dispense Refill  . albuterol (PROVENTIL HFA;VENTOLIN HFA) 108 (90 BASE) MCG/ACT inhaler Inhale 2 puffs into the lungs every 6 (six) hours as needed for wheezing or shortness of breath.  1 Inhaler  2  . fluticasone (FLONASE) 50 MCG/ACT nasal spray Place 2 sprays into both nostrils daily.  16 g  2  . predniSONE (DELTASONE) 10 MG tablet Take 3 tablets (30 mg total) by mouth daily.  15 tablet  0    Exam:  BP 140/86  Pulse 88  Temp(Src) 99.1 F (37.3 C) (Oral)  Resp 20  SpO2 100%  LMP 08/25/2013 Gen: Well NAD HEENT: EOMI,  MMM posterior pharyngeal cobblestoning. Normal tympanic membranes bilaterally. Lungs: Normal work of breathing. CTABL Heart: RRR no MRG Abd: NABS, Soft. NT, ND Exts: Brisk capillary refill, warm and well perfused.  Patient was given a DuoNeb nebulizer treatment and had improvement in symptoms.   No results found for this or any previous visit (from the past 24 hour(s)). No results found.  Assessment and Plan: 38 y.o. female with bronchitis. Plan to treat with prednisone Flonase nasal spray and albuterol.  Discussed warning signs or symptoms. Please see discharge instructions. Patient expresses  understanding.    Rodolph BongEvan S Lindsee Labarre, MD 09/17/13 [phone]

## 2013-09-17 NOTE — Discharge Instructions (Signed)
Thank you for coming in today. °Call or go to the emergency room if you get worse, have trouble breathing, have chest pains, or palpitations.  ° °Bronchitis °Bronchitis is inflammation of the airways that extend from the windpipe into the lungs (bronchi). The inflammation often causes mucus to develop, which leads to a cough. If the inflammation becomes severe, it may cause shortness of breath. °CAUSES  °Bronchitis may be caused by:  °· Viral infections.   °· Bacteria.   °· Cigarette smoke.   °· Allergens, pollutants, and other irritants.   °SIGNS AND SYMPTOMS  °The most common symptom of bronchitis is a frequent cough that produces mucus. Other symptoms include: °· Fever.   °· Body aches.   °· Chest congestion.   °· Chills.   °· Shortness of breath.   °· Sore throat.   °DIAGNOSIS  °Bronchitis is usually diagnosed through a medical history and physical exam. Tests, such as chest X-rays, are sometimes done to rule out other conditions.  °TREATMENT  °You may need to avoid contact with whatever caused the problem (smoking, for example). Medicines are sometimes needed. These may include: °· Antibiotics. These may be prescribed if the condition is caused by bacteria. °· Cough suppressants. These may be prescribed for relief of cough symptoms.   °· Inhaled medicines. These may be prescribed to help open your airways and make it easier for you to breathe.   °· Steroid medicines. These may be prescribed for those with recurrent (chronic) bronchitis. °HOME CARE INSTRUCTIONS °· Get plenty of rest.   °· Drink enough fluids to keep your urine clear or pale yellow (unless you have a medical condition that requires fluid restriction). Increasing fluids may help thin your secretions and will prevent dehydration.   °· Only take over-the-counter or prescription medicines as directed by your health care provider. °· Only take antibiotics as directed. Make sure you finish them even if you start to feel better. °· Avoid secondhand  smoke, irritating chemicals, and strong fumes. These will make bronchitis worse. If you are a smoker, quit smoking. Consider using nicotine gum or skin patches to help control withdrawal symptoms. Quitting smoking will help your lungs heal faster.   °· Put a cool-mist humidifier in your bedroom at night to moisten the air. This may help loosen mucus. Change the water in the humidifier daily. You can also run the hot water in your shower and sit in the bathroom with the door closed for 5 10 minutes.   °· Follow up with your health care provider as directed.   °· Wash your hands frequently to avoid catching bronchitis again or spreading an infection to others.   °SEEK MEDICAL CARE IF: °Your symptoms do not improve after 1 week of treatment.  °SEEK IMMEDIATE MEDICAL CARE IF: °· Your fever increases. °· You have chills.   °· You have chest pain.   °· You have worsening shortness of breath.   °· You have bloody sputum. °· You faint.   °· You have lightheadedness. °· You have a severe headache.   °· You vomit repeatedly. °MAKE SURE YOU:  °· Understand these instructions. °· Will watch your condition. °· Will get help right away if you are not doing well or get worse. °Document Released: 04/16/2005 Document Revised: 02/04/2013 Document Reviewed: 12/09/2012 °[employer]® Patient Information ©2014 [employer]. ° °

## 2013-09-17 NOTE — ED Notes (Signed)
C/o bronchitis States she has some wheezing, fever, nausea,weak feeling, sneezing, coughing, and runny nose Denies any vomiting Did take motrin as tx

## 2015-07-12 ENCOUNTER — Encounter (HOSPITAL_COMMUNITY): Payer: Self-pay

## 2015-07-12 ENCOUNTER — Emergency Department (HOSPITAL_COMMUNITY)
Admission: EM | Admit: 2015-07-12 | Discharge: 2015-07-13 | Disposition: A | Discharge status: Home / Self Care | Payer: 59 | Attending: Emergency Medicine | Admitting: Emergency Medicine

## 2015-07-12 DIAGNOSIS — M5441 Lumbago with sciatica, right side: Secondary | ICD-10-CM | POA: Insufficient documentation

## 2015-07-12 DIAGNOSIS — Z7952 Long term (current) use of systemic steroids: Secondary | ICD-10-CM | POA: Diagnosis not present

## 2015-07-12 DIAGNOSIS — Z8709 Personal history of other diseases of the respiratory system: Secondary | ICD-10-CM | POA: Insufficient documentation

## 2015-07-12 DIAGNOSIS — M79604 Pain in right leg: Secondary | ICD-10-CM | POA: Diagnosis present

## 2015-07-12 DIAGNOSIS — Z87891 Personal history of nicotine dependence: Secondary | ICD-10-CM | POA: Diagnosis not present

## 2015-07-12 DIAGNOSIS — Z7951 Long term (current) use of inhaled steroids: Secondary | ICD-10-CM | POA: Insufficient documentation

## 2015-07-12 DIAGNOSIS — Z79899 Other long term (current) drug therapy: Secondary | ICD-10-CM | POA: Insufficient documentation

## 2015-07-12 DIAGNOSIS — R200 Anesthesia of skin: Secondary | ICD-10-CM | POA: Insufficient documentation

## 2015-07-12 NOTE — ED Provider Notes (Signed)
CSN: [id number]     Arrival date & time 07/12/15  2220 History  By signing my name below, I, Kristina Calhoun, attest that this documentation has been prepared under the direction and in the presence of non-physician practitioner, Melburn HakeNicole Lajoya Dombek, PA-C. Electronically Signed: Freida Busmaniana Calhoun, Scribe. 07/12/2015. 12:34 AM.  Chief Complaint  Patient presents with  . Leg Pain    The history is provided by the patient. No language interpreter was used.     HPI Comments:  Kristina Calhoun is a [age] y.o. female who presents to the Emergency Department complaining of gradually worsening, "pulling" pain from her right hip down her poster right leg x 2 weeks. Pt reports associated intermittent tingling and numbness in the RLE which began today. She denies recent heavy lifting, fall, trauma, or injury; she notes she sits for long periods of time at work. Her pain is exacerbated when bending or sitting. She denies fever, abdomoinal pain, numbness/tingling in her groin, bowel/bladder incontinence, abdominal pain, N/V/D, urinary retention and weakness in her BLE.  She also denies h/o same pain,  h/o CA and IVDA. Marland Kitchen.She has taken Aleve, Excedrin, and Ibuprofen with little relief; last dose was ~ 1700.    Past Medical History  Diagnosis Date  . Bronchitis   . Ectopic pregnancy    Past Surgical History  Procedure Laterality Date  . Etopic pregnancy  ectopic surgery   No family history on file. Social History  Substance Use Topics  . Smoking status: Former Smoker    Types: Cigarettes    Quit date: 08/28/2011  . Smokeless tobacco: None     Comment: uses E cigarettes  . Alcohol Use: No   OB History    No data available     Review of Systems  Constitutional: Negative for fever.  Gastrointestinal: Negative for abdominal pain.  Musculoskeletal: Positive for myalgias and arthralgias.  Neurological: Positive for numbness. Negative for weakness.    Allergies  Sulfa antibiotics  Home Medications    Prior to Admission medications   Medication Sig Start Date End Date Taking? Authorizing Provider  albuterol (PROVENTIL HFA;VENTOLIN HFA) 108 (90 BASE) MCG/ACT inhaler Inhale 2 puffs into the lungs every 6 (six) hours as needed for wheezing or shortness of breath. 09/17/13   Rodolph BongEvan S Corey, MD  fluticasone (FLONASE) 50 MCG/ACT nasal spray Place 2 sprays into both nostrils daily. 09/17/13   Rodolph BongEvan S Corey, MD  HYDROcodone-acetaminophen (NORCO/VICODIN) 5-325 MG tablet Take 2 tablets by mouth every 4 (four) hours as needed. 07/13/15   Barrett HenleNicole Elizabeth Monisha Siebel, PA-C  predniSONE (DELTASONE) 10 MG tablet Take 3 tablets (30 mg total) by mouth daily. 09/17/13   Rodolph BongEvan S Corey, MD   BP 169/99 mmHg  Pulse 95  Temp(Src) 98.5 F (36.9 C) (Oral)  Resp 18  Ht 5\' 9"  (1.753 m)  Wt 133.953 kg  BMI 43.59 kg/m2  SpO2 99%  LMP 06/28/2015 Physical Exam  Constitutional: She is oriented to person, place, and time. She appears well-developed and well-nourished. No distress.  HENT:  Head: Normocephalic and atraumatic.  Mouth/Throat: Oropharynx is clear and moist. No oropharyngeal exudate.  Eyes: Conjunctivae and EOM are normal. Right eye exhibits no discharge. Left eye exhibits no discharge. No scleral icterus.  Neck: Normal range of motion. Neck supple.  Cardiovascular: Normal rate, regular rhythm, normal heart sounds and intact distal pulses.   No murmur heard. Pulmonary/Chest: Effort normal and breath sounds normal. No respiratory distress. She has no wheezes. She has no rales. She exhibits  no tenderness.  Abdominal: Soft. Bowel sounds are normal. She exhibits no distension and no mass. There is no tenderness. There is no rebound and no guarding.  Musculoskeletal: Normal range of motion. She exhibits tenderness. She exhibits no edema.  No C/T/L midline tenderness  FROM of neck and back  Mild tenderness over right lumbar paraspinal muscles and gluteus maximus FROM of BLE with 5/5 strength  Sensation intact 2+ DP  pulses Positive right straight leg raise  Pt able to stand and ambulate without assistance    Neurological: She is alert and oriented to person, place, and time. She has normal strength and normal reflexes. No sensory deficit. Gait normal.  Skin: Skin is warm and dry.  Nursing note and vitals reviewed.   ED Course  Procedures   DIAGNOSTIC STUDIES:  Oxygen Saturation is 99% on RA, normal by my interpretation.    COORDINATION OF CARE:  11:09 PM Will order XR of Lumbar spine  Discussed treatment plan with pt at bedside and pt agreed to plan.  Imaging Review Dg Lumbar Spine Complete  07/13/2015  CLINICAL DATA:  Right lower back pain radiating to the leg for 1 day. EXAM: LUMBAR SPINE - COMPLETE 4+ VIEW COMPARISON:  None. FINDINGS: No fracture, subluxation, endplate erosion, or evidence of focal bone lesion. Mild L4-5 and L5-S1 disc narrowing with small endplate spurs. IMPRESSION: 1. No acute finding. 2. Mild L4-5 and L5-S1 disc narrowing. Electronically Signed   By: Marnee Spring M.D.   On: 07/13/2015 00:22   I have personally reviewed and evaluated these images and lab results as part of my medical decision-making.    MDM   Final diagnoses:  Right-sided low back pain with right-sided sciatica    Patient presents with right lower back pain radiating down her right leg.  VSS. Exam revealed mild tenderness over right lumbar paraspinal muscles, positive right straight leg raise. Normal neurological exam, no evidence of urinary incontinence or retention, pain is consistently reproducible. There is no evidence of AAA or concern for dissection at this time.   Patient can walk but states is painful.  No loss of bowel or bladder control.  No concern for cauda equina.  No fever, night sweats, weight loss, h/o cancer, IVDU. Lumbar spine x-ray revealed mild L4-5 and L5-S1 disc narrowing. Pain treated here in the department with adequate improvement. RICE protocol and pain medicine indicated and  discussed with patient. I have also discussed reasons to return immediately to the ER.  Patient expresses understanding and agrees with plan.  I personally performed the services described in this documentation, which was scribed in my presence. The recorded information has been reviewed and is accurate.    Satira Sark [place], [place] 07/13/15 0037  Tomasita Crumble, MD 07/13/15 [phone]

## 2015-07-12 NOTE — ED Notes (Signed)
Pt here with c/o right hip pain that radiates down her right leg, onset 2 weeks ago. Pt denies leg swelling or redness. Denies injury.

## 2015-07-13 ENCOUNTER — Emergency Department (HOSPITAL_COMMUNITY): Payer: 59

## 2015-07-13 MED ORDER — KETOROLAC TROMETHAMINE 60 MG/2ML IM SOLN
30.0000 mg | Freq: Once | INTRAMUSCULAR | Status: AC
  Administered 2015-07-13: 30 mg via INTRAMUSCULAR
  Filled 2015-07-13: qty 2

## 2015-07-13 MED ORDER — HYDROCODONE-ACETAMINOPHEN 5-325 MG PO TABS: 2.0000 | ORAL_TABLET | ORAL | Status: DC | PRN

## 2015-07-13 NOTE — Discharge Instructions (Signed)
Take your medications as prescribed. You may also continue taking 600 mg of ibuprofen 4 times daily as needed for pain relief. I recommend applying ice and/or heat to affected area for 15-20 minutes 3-4 times daily. Refrain from doing any heavy lifting, exercising or repetitive movements that exacerbate your symptoms. Please follow up with a primary care provider from the Resource Guide provided below in 5-7 days as needed. Please return to the Emergency Department if symptoms worsen or new onset of fever, numbness, tingling, saddle anesthesia, loss of bowel or bladder, weakness.

## 2015-09-01 ENCOUNTER — Other Ambulatory Visit: Payer: Self-pay | Admitting: Orthopedic Surgery

## 2015-09-01 DIAGNOSIS — M545 Low back pain: Secondary | ICD-10-CM

## 2015-09-08 ENCOUNTER — Other Ambulatory Visit: Payer: 59

## 2015-09-09 ENCOUNTER — Other Ambulatory Visit: Payer: 59

## 2015-09-12 ENCOUNTER — Other Ambulatory Visit: Payer: 59

## 2015-09-18 ENCOUNTER — Ambulatory Visit
Admission: RE | Admit: 2015-09-18 | Discharge: 2015-09-18 | Disposition: A | Discharge status: Home / Self Care | Payer: 59 | Source: Ambulatory Visit | Attending: Orthopedic Surgery | Admitting: Orthopedic Surgery

## 2015-09-18 DIAGNOSIS — M545 Low back pain: Secondary | ICD-10-CM

## 2016-10-14 ENCOUNTER — Emergency Department (HOSPITAL_COMMUNITY)
Admission: EM | Admit: 2016-10-14 | Discharge: 2016-10-14 | Disposition: A | Discharge status: Home / Self Care | Payer: 59 | Attending: Emergency Medicine | Admitting: Emergency Medicine

## 2016-10-14 ENCOUNTER — Encounter (HOSPITAL_COMMUNITY): Payer: Self-pay

## 2016-10-14 DIAGNOSIS — Z87891 Personal history of nicotine dependence: Secondary | ICD-10-CM | POA: Insufficient documentation

## 2016-10-14 DIAGNOSIS — Z5321 Procedure and treatment not carried out due to patient leaving prior to being seen by health care provider: Secondary | ICD-10-CM | POA: Diagnosis not present

## 2016-10-14 DIAGNOSIS — H5711 Ocular pain, right eye: Secondary | ICD-10-CM | POA: Insufficient documentation

## 2016-10-14 NOTE — ED Triage Notes (Signed)
Pt complaining of R eye pain x 1 week. Pt with some redness to R eye. Pt denies any injury/trauma to eye. Pt denies any blurry vision/double vision.

## 2016-10-14 NOTE — ED Notes (Signed)
Called for vitals with no answer received.

## 2016-10-14 NOTE — ED Notes (Signed)
No answer when called to room

## 2017-11-15 ENCOUNTER — Other Ambulatory Visit: Payer: Self-pay

## 2017-11-15 ENCOUNTER — Encounter (HOSPITAL_COMMUNITY): Payer: Self-pay | Admitting: Emergency Medicine

## 2017-11-15 ENCOUNTER — Emergency Department (HOSPITAL_COMMUNITY)
Admission: EM | Admit: 2017-11-15 | Discharge: 2017-11-15 | Disposition: A | Discharge status: Home / Self Care | Payer: 59 | Attending: Emergency Medicine | Admitting: Emergency Medicine

## 2017-11-15 DIAGNOSIS — R3121 Asymptomatic microscopic hematuria: Secondary | ICD-10-CM | POA: Diagnosis not present

## 2017-11-15 DIAGNOSIS — Z79899 Other long term (current) drug therapy: Secondary | ICD-10-CM | POA: Diagnosis not present

## 2017-11-15 DIAGNOSIS — R1013 Epigastric pain: Secondary | ICD-10-CM | POA: Diagnosis not present

## 2017-11-15 DIAGNOSIS — I10 Essential (primary) hypertension: Secondary | ICD-10-CM | POA: Diagnosis not present

## 2017-11-15 DIAGNOSIS — Z87891 Personal history of nicotine dependence: Secondary | ICD-10-CM | POA: Diagnosis not present

## 2017-11-15 LAB — COMPREHENSIVE METABOLIC PANEL
ALT: 28 U/L (ref 0–44)
AST: 18 U/L (ref 15–41)
Albumin: 4.1 g/dL (ref 3.5–5.0)
Alkaline Phosphatase: 50 U/L (ref 38–126)
Anion gap: 9 (ref 5–15)
BUN: <5 mg/dL — ABNORMAL LOW (ref 6–20)
CO2: 24 mmol/L (ref 22–32)
Calcium: 8.9 mg/dL (ref 8.9–10.3)
Chloride: 105 mmol/L (ref 98–111)
Creatinine, Ser: 0.8 mg/dL (ref 0.44–1.00)
GFR calc non Af Amer: >60 mL/min (ref >60)
Glucose, Bld: 100 mg/dL — ABNORMAL HIGH (ref 70–99)
Potassium: 3.6 mmol/L (ref 3.5–5.1)
SODIUM: 138 mmol/L (ref 135–145)
Total Bilirubin: 0.3 mg/dL (ref 0.3–1.2)
Total Protein: 7 g/dL (ref 6.5–8.1)

## 2017-11-15 LAB — URINALYSIS, ROUTINE W REFLEX MICROSCOPIC
BILIRUBIN URINE: NEGATIVE
Bacteria, UA: NONE SEEN
Glucose, UA: NEGATIVE mg/dL
Ketones, ur: NEGATIVE mg/dL
LEUKOCYTES UA: NEGATIVE
Nitrite: NEGATIVE
PH: 8 (ref 5.0–8.0)
PROTEIN: NEGATIVE mg/dL
Specific Gravity, Urine: 1.006 (ref 1.005–1.030)

## 2017-11-15 LAB — I-STAT TROPONIN, ED: TROPONIN I, POC: 0 ng/mL (ref 0.00–0.08)

## 2017-11-15 LAB — CBC
HCT: 38.5 % (ref 36.0–46.0)
HEMOGLOBIN: 12.2 g/dL (ref 12.0–15.0)
MCH: 27.2 pg (ref 26.0–34.0)
MCHC: 31.7 g/dL (ref 30.0–36.0)
MCV: 85.9 fL (ref 78.0–100.0)
Platelets: 280 10*3/uL (ref 150–400)
RBC: 4.48 MIL/uL (ref 3.87–5.11)
RDW: 15.1 % (ref 11.5–15.5)
WBC: 10 10*3/uL (ref 4.0–10.5)

## 2017-11-15 LAB — I-STAT BETA HCG BLOOD, ED (MC, WL, AP ONLY): I-stat hCG, quantitative: <5 m[IU]/mL (ref <5)

## 2017-11-15 LAB — LIPASE, BLOOD: Lipase: 26 U/L (ref 11–51)

## 2017-11-15 MED ORDER — OMEPRAZOLE 20 MG PO CPDR: 20.0000 mg | DELAYED_RELEASE_CAPSULE | Freq: Two times a day (BID) | ORAL | 0 refills | Status: DC

## 2017-11-15 MED ORDER — GI COCKTAIL ~~LOC~~
30.0000 mL | Freq: Once | ORAL | Status: AC
  Administered 2017-11-15: 30 mL via ORAL
  Filled 2017-11-15: qty 30

## 2017-11-15 NOTE — Discharge Instructions (Signed)
Please take Prilosec along with your ranitidine 30 minutes before each major meals for your reflux.  Your blood pressure is high today, you will need to have it recheck by your provider.  You have blood in your urine, follow up with Urologist for recheck of your urine.  Return if you have any concerns.

## 2017-11-15 NOTE — ED Triage Notes (Signed)
Pt c/o generalized abdominal pain and nausea that started last night. Hx GERD, reports medications did not help.

## 2017-11-15 NOTE — ED Provider Notes (Signed)
[facility] Provider Note   CSN: [id number] Arrival date & time: 11/15/17  0254     History   Chief Complaint Chief Complaint  Patient presents with  . Abdominal Pain    HPI Kristina Calhoun is a 42 y.o. female.  The history is provided by the patient. No language interpreter was used.  Abdominal Pain       42 year old female with history of GERD presenting for evaluation of abdominal pain.  Patient report occasional GERD symptoms usually improved with taking her ranitidine and going to sleep.  That usually resolved however since 11 AM yesterday she has had a persistent epigastric burning sensation radiates to her back that has not improved despite taking her normal medication.  She currently rates her pain a 7 out of 10.  Nothing seems to make the pain better or worse.  She denies any associated fever, lightheadedness, dizziness, chest pain, shortness of breath, productive cough, hemoptysis, nausea, vomiting, constipation or diarrhea.  No urinary symptoms.  She is currently not on her menstruation.  She does not have any history of kidney stone.  She does not have any history of hypertension.  She is occasional smoker.    Past Medical History:  Diagnosis Date  . Bronchitis   . Ectopic pregnancy     There are no active problems to display for this patient.   Past Surgical History:  Procedure Laterality Date  . etopic pregnancy  ectopic surgery     OB History   None      Home Medications    Prior to Admission medications   Medication Sig Start Date End Date Taking? Authorizing Provider  albuterol (PROVENTIL HFA;VENTOLIN HFA) 108 (90 BASE) MCG/ACT inhaler Inhale 2 puffs into the lungs every 6 (six) hours as needed for wheezing or shortness of breath. 09/17/13   Rodolph Bongorey, Evan S, MD  fluticasone (FLONASE) 50 MCG/ACT nasal spray Place 2 sprays into both nostrils daily. 09/17/13   Rodolph Bongorey, Evan S, MD  HYDROcodone-acetaminophen  (NORCO/VICODIN) 5-325 MG tablet Take 2 tablets by mouth every 4 (four) hours as needed. 07/13/15   Barrett HenleNadeau, Nicole Elizabeth, PA-C  predniSONE (DELTASONE) 10 MG tablet Take 3 tablets (30 mg total) by mouth daily. 09/17/13   Rodolph Bongorey, Evan S, MD    Family History No family history on file.  Social History Social History   Tobacco Use  . Smoking status: Former Smoker    Types: Cigarettes    Last attempt to quit: 08/28/2011    Years since quitting: 6.2  . Smokeless tobacco: Never Used  . Tobacco comment: uses E cigarettes  Substance Use Topics  . Alcohol use: No  . Drug use: No     Allergies   Sulfa antibiotics   Review of Systems Review of Systems  Gastrointestinal: Positive for abdominal pain.  All other systems reviewed and are negative.    Physical Exam Updated Vital Signs BP (!) 197/112 (BP Location: Right Arm)   Pulse 66   Temp 98.1 F (36.7 C) (Oral)   Resp 16   SpO2 100%   Physical Exam  Constitutional: She appears well-developed and well-nourished. No distress.  Obese female sitting the bed in no acute discomfort.  HENT:  Head: Atraumatic.  Eyes: Conjunctivae are normal.  Neck: Neck supple.  Cardiovascular: Normal rate.  Pulmonary/Chest: Effort normal and breath sounds normal.  Abdominal: Soft. Normal appearance and bowel sounds are normal. There is tenderness (Very mild epigastric tenderness without guarding or rebound tenderness.)  in the epigastric area. There is no tenderness at McBurney's point and negative Murphy's sign.  Neurological: She is alert.  Skin: No rash noted.  Psychiatric: She has a normal mood and affect.  Nursing note and vitals reviewed.    ED Treatments / Results  Labs (all labs ordered are listed, but only abnormal results are displayed) Labs Reviewed  COMPREHENSIVE METABOLIC PANEL - Abnormal; Notable for the following components:      Result Value   Glucose, Bld 100 (*)    BUN <5 (*)    All other components within normal limits   URINALYSIS, ROUTINE W REFLEX MICROSCOPIC - Abnormal; Notable for the following components:   Color, Urine STRAW (*)    Hgb urine dipstick MODERATE (*)    All other components within normal limits  LIPASE, BLOOD  CBC  I-STAT BETA HCG BLOOD, ED ([facility], [facility], [facility] ONLY)  I-STAT TROPONIN, ED    EKG None   Date: 11/15/2017  Rate: 75  Rhythm: normal sinus rhythm  QRS Axis: normal  Intervals: normal  ST/T Wave abnormalities: normal  Conduction Disutrbances: none  Narrative Interpretation:   Old EKG Reviewed: No significant changes noted     Radiology No results found.  Procedures Procedures (including critical care time)  Medications Ordered in ED Medications - No data to display   Initial Impression / Assessment and Plan / ED Course  I have reviewed the triage vital signs and the nursing notes.  Pertinent labs & imaging results that were available during my care of the patient were reviewed by me and considered in my medical decision making (see chart for details).     BP (!) 171/90   Pulse 64   Temp 98.1 F (36.7 C) (Oral)   Resp 18   Ht 5\' 10"  (1.778 m)   Wt 122.5 kg (270 lb)   SpO2 99%   BMI 38.74 kg/m    Final Clinical Impressions(s) / ED Diagnoses   Final diagnoses:  Epigastric pain  Asymptomatic microscopic hematuria  Hypertension, unspecified type    ED Discharge Orders        Ordered    omeprazole (PRILOSEC) 20 MG capsule  2 times daily before meals     11/15/17 0715     6:38 AM Patient here with epigastric burning sensation similar to her history of GERD.  She does have some minimal epigastric tenderness on exam however no Murphy sign to suggest biliary disease.  Pregnancy test is negative, normal lipase, electro lites panels are reassuring.  Urine shows moderate hemoglobin and urine dipsticks however she is currently not on her menstruation and she does not have any urinary symptoms or flank pain to suggest UTI or kidney stone.  No leukocytosis EKG  and troponin unremarkable.  Her symptoms is not suggestive of ACS or cardiopulmonary disease.  Patient found to be hypertensive with blood pressure of 197/112.  She denies history of hypertension.  We will recheck vital sign.  I do not suspect hypertensive urgency or emergency causing her symptoms.  7:13 AM After the GI cocktail, patient report improvement of her symptoms.  She is resting comfortably.  Her blood pressure did improved.  At this time, patient will be discharged home with PPI to augment her H2 blocker for further treatment of her acid reflux.  I encourage patient to follow-up with her primary care provider for recheck of her blood pressure as it is elevated today.  Her blood and urine will need to be reassessed by  urologist, referral given.  Return precautions discussed.   Fayrene Helper, PA-C 11/15/17 1610    Glynn Octave, MD 11/15/17 [phone]

## 2018-11-19 ENCOUNTER — Other Ambulatory Visit: Payer: Self-pay | Admitting: Neurological Surgery

## 2019-01-28 ENCOUNTER — Other Ambulatory Visit: Payer: Self-pay

## 2019-01-28 ENCOUNTER — Encounter (HOSPITAL_COMMUNITY)
Admission: RE | Admit: 2019-01-28 | Discharge: 2019-01-28 | Disposition: A | Discharge status: Home / Self Care | Payer: 59 | Source: Ambulatory Visit | Attending: Neurological Surgery | Admitting: Neurological Surgery

## 2019-01-28 ENCOUNTER — Encounter (HOSPITAL_COMMUNITY): Payer: Self-pay

## 2019-01-28 DIAGNOSIS — Z01818 Encounter for other preprocedural examination: Secondary | ICD-10-CM | POA: Diagnosis not present

## 2019-01-28 DIAGNOSIS — M5416 Radiculopathy, lumbar region: Secondary | ICD-10-CM | POA: Diagnosis not present

## 2019-01-28 HISTORY — DX: Cardiac murmur, unspecified: R01.1

## 2019-01-28 LAB — CBC
HCT: 34.7 % — ABNORMAL LOW (ref 36.0–46.0)
Hemoglobin: 10.9 g/dL — ABNORMAL LOW (ref 12.0–15.0)
MCH: 27.1 pg (ref 26.0–34.0)
MCHC: 31.4 g/dL (ref 30.0–36.0)
MCV: 86.3 fL (ref 80.0–100.0)
Platelets: 304 10*3/uL (ref 150–400)
RBC: 4.02 MIL/uL (ref 3.87–5.11)
RDW: 15.2 % (ref 11.5–15.5)
WBC: 9 10*3/uL (ref 4.0–10.5)
nRBC: 0 % (ref 0.0–0.2)

## 2019-01-28 LAB — SURGICAL PCR SCREEN
MRSA, PCR: NEGATIVE
Staphylococcus aureus: NEGATIVE

## 2019-01-28 NOTE — Progress Notes (Signed)
[id number] [address], [address] [place] [address] Phone: [phone] Fax: [phone]      Your procedure is scheduled on Monday, 02/02/2019.  Report to [facility] Main Entrance "A" at 0600 A.M., and check in at the Admitting office.  Call this number if you have problems the morning of surgery:  [phone]  Call [phone] if you have any questions prior to your surgery date Monday-Friday 8am-4pm    Remember:  Do not eat or drink after midnight the night before your surgery     Take these medicines the morning of surgery with A SIP OF WATER: Famotadine (Pepcid) - if needed Tizanidine (Zanaflex) - if needed  7 days prior to surgery STOP taking any Diclfenac (Voltaren), Aspirin (unless otherwise instructed by your surgeon) or aspirin-containing products, NSAIDs - Aleve, Naproxen, Ibuprofen, Motrin, Advil, Goody's, BC's, all herbal medications, fish oil, and all vitamins.    The Morning of Surgery  Do not wear jewelry, make-up or nail polish.  Do not wear lotions, powders, perfumes, or deodorant  Do not shave 48 hours prior to surgery.    Do not bring valuables to the hospital.  [facility] is not responsible for any belongings or valuables.  If you are a smoker, DO NOT Smoke 24 hours prior to surgery  If you wear a CPAP at night please bring your mask, tubing, and machine the morning of surgery   Remember that you must have someone to transport you home after your surgery, and remain with you for 24 hours if you are discharged the same day.   Contacts, glasses, hearing aids, dentures or bridgework may not be worn into surgery.    Leave your suitcase in the car.  After surgery it may be brought to your room.  For patients admitted to the hospital, discharge time will be determined by your treatment team.  Patients discharged the day of surgery will not be allowed  to drive home.    Special instructions:   [facility]- Preparing For Surgery  Before surgery, you can play an important role. Because skin is not sterile, your skin needs to be as free of germs as possible. You can reduce the number of germs on your skin by washing with CHG (chlorahexidine gluconate) Soap before surgery.  CHG is an antiseptic cleaner which kills germs and bonds with the skin to continue killing germs even after washing.    Oral Hygiene is also important to reduce your risk of infection.  Remember - BRUSH YOUR TEETH THE MORNING OF SURGERY WITH YOUR REGULAR TOOTHPASTE  Please do not use if you have an allergy to CHG or antibacterial soaps. If your skin becomes reddened/irritated stop using the CHG.  Do not shave (including legs and underarms) for at least 48 hours prior to first CHG shower. It is OK to shave your face.  Please follow these instructions carefully.   1. Shower the NIGHT BEFORE SURGERY and the MORNING OF SURGERY with CHG Soap.   2. If you chose to wash your hair, wash your hair first as usual with your normal shampoo.  3. After you shampoo, rinse your hair and body thoroughly to remove the shampoo.  4. Use CHG as you would any other liquid soap. You can apply CHG directly to the skin and wash gently with a scrungie or a clean washcloth.   5. Apply the CHG  Soap to your body ONLY FROM THE NECK DOWN.  Do not use on open wounds or open sores. Avoid contact with your eyes, ears, mouth and genitals (private parts). Wash Face and genitals (private parts)  with your normal soap.   6. Wash thoroughly, paying special attention to the area where your surgery will be performed.  7. Thoroughly rinse your body with warm water from the neck down.  8. DO NOT shower/wash with your normal soap after using and rinsing off the CHG Soap.  9. Pat yourself dry with a CLEAN TOWEL.  10. Wear CLEAN PAJAMAS to bed the night before surgery, wear comfortable clothes the morning of  surgery  11. Place CLEAN SHEETS on your bed the night of your first shower and DO NOT SLEEP WITH PETS.    Day of Surgery:  Please shower the morning of surgery with the CHG soap Do not apply any deodorants/lotions.  Please wear clean clothes to the hospital/surgery center.   Remember to brush your teeth WITH YOUR REGULAR TOOTHPASTE.   Please read over the following fact sheets that you were given.

## 2019-01-28 NOTE — Progress Notes (Signed)
PCP - Julian Hy, PA Cardiologist - N/A  PPM/ICD - N/A Device Orders - N/A Rep Notified N/A  Chest x-ray - N/A EKG - N/A Stress Test -  N/A ECHO - > 20 Years ago after being diagnosed with murmur ~ 16 during illness Cardiac Cath -  N/A  Sleep Study - N/A CPAP - N/A  Fasting Blood Sugar - N/A Checks Blood Sugar __N/A___ times a day  Blood Thinner Instructions: N/A Aspirin Instructions: N/A  ERAS Protcol - N/A PRE-SURGERY Ensure - N/A  COVID TEST- 01/29/2019   Anesthesia review: Seen by Jeneen Rinks, PA during PAT visit to evaluate for murmur. Per Jeneen Rinks, no further follow up needed   Patient denies shortness of breath, fever, cough and chest pain at PAT appointment  Patient's BP slightly elevated upon arrival to PAT visit. Patient had already left before  it could be rechecked  Coronavirus Screening  Have you experienced the following symptoms:  Cough yes/no: No Fever (>100.42F)  yes/no: No Runny nose yes/no: No Sore throat yes/no: No Difficulty breathing/shortness of breath  yes/no: No  Have you or a family member traveled in the last 14 days and where? yes/no: No   If the patient indicates "YES" to the above questions, their PAT will be rescheduled to limit the exposure to others and, the surgeon will be notified. THE PATIENT WILL NEED TO BE ASYMPTOMATIC FOR 14 DAYS.   If the patient is not experiencing any of these symptoms, the PAT nurse will instruct them to NOT bring anyone with them to their appointment since they may have these symptoms or traveled as well.   Please remind your patients and families that hospital visitation restrictions are in effect and the importance of the restrictions.     Patient verbalized understanding of instructions that were given to them at the PAT appointment. Patient was also instructed that they will need to review over the PAT instructions again at home before surgery.

## 2019-01-29 ENCOUNTER — Other Ambulatory Visit (HOSPITAL_COMMUNITY)
Admission: RE | Admit: 2019-01-29 | Discharge: 2019-01-29 | Disposition: A | Discharge status: Home / Self Care | Payer: 59 | Source: Ambulatory Visit | Attending: Neurological Surgery | Admitting: Neurological Surgery

## 2019-01-29 DIAGNOSIS — Z20828 Contact with and (suspected) exposure to other viral communicable diseases: Secondary | ICD-10-CM | POA: Insufficient documentation

## 2019-01-30 LAB — NOVEL CORONAVIRUS, NAA (HOSP ORDER, SEND-OUT TO REF LAB; TAT 18-24 HRS): SARS-CoV-2, NAA: NOT DETECTED

## 2019-01-30 MED ORDER — DEXTROSE 5 % IV SOLN
3.0000 g | INTRAVENOUS | Status: AC
  Administered 2019-02-02: 08:00:00 3 g via INTRAVENOUS
  Filled 2019-01-30: qty 3000
  Filled 2019-01-30: qty 3

## 2019-02-02 ENCOUNTER — Ambulatory Visit (HOSPITAL_COMMUNITY): Payer: 59 | Admitting: Physician Assistant

## 2019-02-02 ENCOUNTER — Observation Stay (HOSPITAL_COMMUNITY)
Admission: RE | Admit: 2019-02-02 | Discharge: 2019-02-03 | Disposition: A | Discharge status: Home / Self Care | Payer: 59 | Attending: Neurological Surgery | Admitting: Neurological Surgery

## 2019-02-02 ENCOUNTER — Other Ambulatory Visit: Payer: Self-pay

## 2019-02-02 ENCOUNTER — Encounter (HOSPITAL_COMMUNITY)
Admission: RE | Disposition: A | Discharge status: Home / Self Care | Payer: Self-pay | Source: Home / Self Care | Attending: Neurological Surgery

## 2019-02-02 ENCOUNTER — Encounter (HOSPITAL_COMMUNITY): Payer: Self-pay

## 2019-02-02 ENCOUNTER — Ambulatory Visit (HOSPITAL_COMMUNITY): Payer: 59

## 2019-02-02 ENCOUNTER — Ambulatory Visit (HOSPITAL_COMMUNITY): Payer: 59 | Admitting: Certified Registered Nurse Anesthetist

## 2019-02-02 DIAGNOSIS — Z87891 Personal history of nicotine dependence: Secondary | ICD-10-CM | POA: Diagnosis not present

## 2019-02-02 DIAGNOSIS — M5416 Radiculopathy, lumbar region: Secondary | ICD-10-CM | POA: Diagnosis present

## 2019-02-02 DIAGNOSIS — Z419 Encounter for procedure for purposes other than remedying health state, unspecified: Secondary | ICD-10-CM

## 2019-02-02 HISTORY — PX: LUMBAR LAMINECTOMY/DECOMPRESSION MICRODISCECTOMY: SHX5026

## 2019-02-02 LAB — POCT PREGNANCY, URINE: Preg Test, Ur: NEGATIVE

## 2019-02-02 SURGERY — LUMBAR LAMINECTOMY/DECOMPRESSION MICRODISCECTOMY 1 LEVEL
Anesthesia: General | Site: Spine Lumbar | Laterality: Right

## 2019-02-02 MED ORDER — SODIUM CHLORIDE 0.9 % IV SOLN: 250.0000 mL | INTRAVENOUS | Status: DC

## 2019-02-02 MED ORDER — THROMBIN 5000 UNITS EX SOLR
OROMUCOSAL | Status: DC | PRN
  Administered 2019-02-02: 5 mL

## 2019-02-02 MED ORDER — ONDANSETRON HCL 4 MG PO TABS: 4.0000 mg | ORAL_TABLET | Freq: Four times a day (QID) | ORAL | Status: DC | PRN

## 2019-02-02 MED ORDER — PHENOL 1.4 % MT LIQD: 1.0000 | OROMUCOSAL | Status: DC | PRN

## 2019-02-02 MED ORDER — CEFAZOLIN SODIUM-DEXTROSE 2-4 GM/100ML-% IV SOLN
2.0000 g | Freq: Three times a day (TID) | INTRAVENOUS | Status: AC
  Administered 2019-02-02 (×2): 2 g via INTRAVENOUS
  Filled 2019-02-02 (×2): qty 100

## 2019-02-02 MED ORDER — CHLORHEXIDINE GLUCONATE CLOTH 2 % EX PADS: 6.0000 | MEDICATED_PAD | Freq: Once | CUTANEOUS | Status: DC

## 2019-02-02 MED ORDER — OXYCODONE HCL 5 MG PO TABS: 5.0000 mg | ORAL_TABLET | ORAL | Status: DC | PRN

## 2019-02-02 MED ORDER — PROPOFOL 10 MG/ML IV BOLUS
INTRAVENOUS | Status: AC
  Filled 2019-02-02: qty 20

## 2019-02-02 MED ORDER — MENTHOL 3 MG MT LOZG
1.0000 | LOZENGE | OROMUCOSAL | Status: DC | PRN
  Administered 2019-02-02: 3 mg via ORAL
  Filled 2019-02-02: qty 9

## 2019-02-02 MED ORDER — THROMBIN 5000 UNITS EX SOLR
CUTANEOUS | Status: AC
  Filled 2019-02-02: qty 5000

## 2019-02-02 MED ORDER — DEXAMETHASONE SODIUM PHOSPHATE 10 MG/ML IJ SOLN
INTRAMUSCULAR | Status: DC | PRN
  Administered 2019-02-02: 4 mg via INTRAVENOUS

## 2019-02-02 MED ORDER — ONDANSETRON HCL 4 MG/2ML IJ SOLN: 4.0000 mg | Freq: Once | INTRAMUSCULAR | Status: DC | PRN

## 2019-02-02 MED ORDER — ACETAMINOPHEN 325 MG PO TABS: 650.0000 mg | ORAL_TABLET | ORAL | Status: DC | PRN

## 2019-02-02 MED ORDER — HYDROMORPHONE HCL 1 MG/ML IJ SOLN
1.0000 mg | INTRAMUSCULAR | Status: DC | PRN
  Administered 2019-02-02: 12:00:00 1 mg via INTRAVENOUS
  Filled 2019-02-02: qty 1

## 2019-02-02 MED ORDER — METHYLPREDNISOLONE ACETATE 80 MG/ML IJ SUSP
INTRAMUSCULAR | Status: AC
  Filled 2019-02-02: qty 1

## 2019-02-02 MED ORDER — FENTANYL CITRATE (PF) 250 MCG/5ML IJ SOLN
INTRAMUSCULAR | Status: DC | PRN
  Administered 2019-02-02: 25 ug via INTRAVENOUS
  Administered 2019-02-02: 50 ug via INTRAVENOUS
  Administered 2019-02-02: 100 ug via INTRAVENOUS

## 2019-02-02 MED ORDER — MIDAZOLAM HCL 2 MG/2ML IJ SOLN
INTRAMUSCULAR | Status: AC
  Filled 2019-02-02: qty 2

## 2019-02-02 MED ORDER — FENTANYL CITRATE (PF) 100 MCG/2ML IJ SOLN
25.0000 ug | INTRAMUSCULAR | Status: DC | PRN
  Administered 2019-02-02 (×4): 25 ug via INTRAVENOUS

## 2019-02-02 MED ORDER — LIDOCAINE 2% (20 MG/ML) 5 ML SYRINGE
INTRAMUSCULAR | Status: DC | PRN
  Administered 2019-02-02: 60 mg via INTRAVENOUS

## 2019-02-02 MED ORDER — ROCURONIUM BROMIDE 10 MG/ML (PF) SYRINGE
PREFILLED_SYRINGE | INTRAVENOUS | Status: DC | PRN
  Administered 2019-02-02: 50 mg via INTRAVENOUS

## 2019-02-02 MED ORDER — SODIUM CHLORIDE 0.9% FLUSH
3.0000 mL | Freq: Two times a day (BID) | INTRAVENOUS | Status: DC
  Administered 2019-02-02 (×2): 3 mL via INTRAVENOUS

## 2019-02-02 MED ORDER — MIDAZOLAM HCL 5 MG/5ML IJ SOLN
INTRAMUSCULAR | Status: DC | PRN
  Administered 2019-02-02: 2 mg via INTRAVENOUS

## 2019-02-02 MED ORDER — PROPOFOL 10 MG/ML IV BOLUS
INTRAVENOUS | Status: DC | PRN
  Administered 2019-02-02: 30 mg via INTRAVENOUS
  Administered 2019-02-02: 160 mg via INTRAVENOUS

## 2019-02-02 MED ORDER — DOCUSATE SODIUM 100 MG PO CAPS
100.0000 mg | ORAL_CAPSULE | Freq: Two times a day (BID) | ORAL | Status: DC
  Administered 2019-02-02 – 2019-02-03 (×3): 100 mg via ORAL
  Filled 2019-02-02 (×3): qty 1

## 2019-02-02 MED ORDER — OXYCODONE HCL 5 MG PO TABS
10.0000 mg | ORAL_TABLET | ORAL | Status: DC | PRN
  Administered 2019-02-02 – 2019-02-03 (×6): 10 mg via ORAL
  Filled 2019-02-02 (×6): qty 2

## 2019-02-02 MED ORDER — FAMOTIDINE 20 MG PO TABS: 20.0000 mg | ORAL_TABLET | Freq: Every day | ORAL | Status: DC | PRN

## 2019-02-02 MED ORDER — SODIUM CHLORIDE 0.9 % IV SOLN
INTRAVENOUS | Status: DC | PRN
  Administered 2019-02-02: 500 mL

## 2019-02-02 MED ORDER — SUGAMMADEX SODIUM 200 MG/2ML IV SOLN
INTRAVENOUS | Status: DC | PRN
  Administered 2019-02-02: 300 mg via INTRAVENOUS

## 2019-02-02 MED ORDER — ONDANSETRON HCL 4 MG/2ML IJ SOLN: 4.0000 mg | Freq: Four times a day (QID) | INTRAMUSCULAR | Status: DC | PRN

## 2019-02-02 MED ORDER — OXYCODONE HCL 5 MG PO TABS: 5.0000 mg | ORAL_TABLET | Freq: Three times a day (TID) | ORAL | 0 refills | Status: AC | PRN

## 2019-02-02 MED ORDER — LACTATED RINGERS IV SOLN
INTRAVENOUS | Status: DC | PRN
  Administered 2019-02-02: 07:00:00 via INTRAVENOUS

## 2019-02-02 MED ORDER — SODIUM CHLORIDE 0.9% FLUSH: 3.0000 mL | INTRAVENOUS | Status: DC | PRN

## 2019-02-02 MED ORDER — 0.9 % SODIUM CHLORIDE (POUR BTL) OPTIME
TOPICAL | Status: DC | PRN
  Administered 2019-02-02: 1000 mL

## 2019-02-02 MED ORDER — ONDANSETRON HCL 4 MG/2ML IJ SOLN
INTRAMUSCULAR | Status: DC | PRN
  Administered 2019-02-02: 4 mg via INTRAVENOUS

## 2019-02-02 MED ORDER — LIDOCAINE-EPINEPHRINE 1 %-1:100000 IJ SOLN
INTRAMUSCULAR | Status: DC | PRN
  Administered 2019-02-02: 4 mL

## 2019-02-02 MED ORDER — TIZANIDINE HCL 4 MG PO TABS
4.0000 mg | ORAL_TABLET | Freq: Three times a day (TID) | ORAL | Status: DC | PRN
  Administered 2019-02-02 – 2019-02-03 (×3): 4 mg via ORAL
  Filled 2019-02-02 (×3): qty 1

## 2019-02-02 MED ORDER — FENTANYL CITRATE (PF) 250 MCG/5ML IJ SOLN
INTRAMUSCULAR | Status: AC
  Filled 2019-02-02: qty 5

## 2019-02-02 MED ORDER — DICLOFENAC SODIUM 75 MG PO TBEC: 75.0000 mg | DELAYED_RELEASE_TABLET | Freq: Two times a day (BID) | ORAL | Status: DC | PRN

## 2019-02-02 MED ORDER — POLYETHYLENE GLYCOL 3350 17 G PO PACK: 17.0000 g | PACK | Freq: Every day | ORAL | Status: DC | PRN

## 2019-02-02 MED ORDER — LIDOCAINE-EPINEPHRINE 1 %-1:100000 IJ SOLN
INTRAMUSCULAR | Status: AC
  Filled 2019-02-02: qty 1

## 2019-02-02 MED ORDER — FENTANYL CITRATE (PF) 100 MCG/2ML IJ SOLN
INTRAMUSCULAR | Status: AC
  Filled 2019-02-02: qty 2

## 2019-02-02 MED ORDER — ACETAMINOPHEN 650 MG RE SUPP: 650.0000 mg | RECTAL | Status: DC | PRN

## 2019-02-02 SURGICAL SUPPLY — 48 items
ADH SKN CLS APL DERMABOND .7 (GAUZE/BANDAGES/DRESSINGS) ×1
BAG DECANTER FOR FLEXI CONT (MISCELLANEOUS) ×3 IMPLANT
BLADE CLIPPER SURG (BLADE) IMPLANT
BLADE SURG 11 STRL SS (BLADE) ×3 IMPLANT
BUR MATCHSTICK NEURO 3.0 LAGG (BURR) ×2 IMPLANT
BUR PRECISION FLUTE 5.0 (BURR) ×2 IMPLANT
CANISTER SUCT 3000ML PPV (MISCELLANEOUS) ×3 IMPLANT
DECANTER SPIKE VIAL GLASS SM (MISCELLANEOUS) ×3 IMPLANT
DERMABOND ADVANCED (GAUZE/BANDAGES/DRESSINGS) ×2
DERMABOND ADVANCED .7 DNX12 (GAUZE/BANDAGES/DRESSINGS) ×1 IMPLANT
DRAPE C-ARM 42X72 X-RAY (DRAPES) ×6 IMPLANT
DRAPE LAPAROTOMY 100X72X124 (DRAPES) ×3 IMPLANT
DRAPE MICROSCOPE LEICA (MISCELLANEOUS) ×3 IMPLANT
DRAPE SURG 17X23 STRL (DRAPES) ×3 IMPLANT
DURAPREP 26ML APPLICATOR (WOUND CARE) ×3 IMPLANT
ELECT REM PT RETURN 9FT ADLT (ELECTROSURGICAL) ×3
ELECTRODE REM PT RTRN 9FT ADLT (ELECTROSURGICAL) ×1 IMPLANT
GAUZE 4X4 16PLY RFD (DISPOSABLE) IMPLANT
GAUZE SPONGE 4X4 12PLY STRL (GAUZE/BANDAGES/DRESSINGS) IMPLANT
GLOVE BIO SURGEON STRL SZ7.5 (GLOVE) ×3 IMPLANT
GLOVE BIOGEL PI IND STRL 7.5 (GLOVE) ×1 IMPLANT
GLOVE BIOGEL PI INDICATOR 7.5 (GLOVE) ×2
GOWN STRL REUS W/ TWL LRG LVL3 (GOWN DISPOSABLE) ×2 IMPLANT
GOWN STRL REUS W/ TWL XL LVL3 (GOWN DISPOSABLE) IMPLANT
GOWN STRL REUS W/TWL 2XL LVL3 (GOWN DISPOSABLE) IMPLANT
GOWN STRL REUS W/TWL LRG LVL3 (GOWN DISPOSABLE) ×6
GOWN STRL REUS W/TWL XL LVL3 (GOWN DISPOSABLE) ×3
HEMOSTAT POWDER KIT SURGIFOAM (HEMOSTASIS) ×3 IMPLANT
KIT BASIN OR (CUSTOM PROCEDURE TRAY) ×3 IMPLANT
KIT TURNOVER KIT B (KITS) ×3 IMPLANT
NDL HYPO 18GX1.5 BLUNT FILL (NEEDLE) IMPLANT
NDL SPNL 18GX3.5 QUINCKE PK (NEEDLE) ×1 IMPLANT
NEEDLE HYPO 18GX1.5 BLUNT FILL (NEEDLE) IMPLANT
NEEDLE HYPO 22GX1.5 SAFETY (NEEDLE) ×3 IMPLANT
NEEDLE SPNL 18GX3.5 QUINCKE PK (NEEDLE) ×3 IMPLANT
NS IRRIG 1000ML POUR BTL (IV SOLUTION) ×3 IMPLANT
PACK LAMINECTOMY NEURO (CUSTOM PROCEDURE TRAY) ×3 IMPLANT
PAD ARMBOARD 7.5X6 YLW CONV (MISCELLANEOUS) ×9 IMPLANT
RUBBERBAND STERILE (MISCELLANEOUS) ×6 IMPLANT
SPONGE LAP 4X18 RFD (DISPOSABLE) IMPLANT
SUT MNCRL AB 3-0 PS2 18 (SUTURE) ×3 IMPLANT
SUT VIC AB 0 CT1 18XCR BRD8 (SUTURE) ×1 IMPLANT
SUT VIC AB 0 CT1 8-18 (SUTURE) ×3
SUT VIC AB 2-0 CT2 18 VCP726D (SUTURE) ×3 IMPLANT
SYR 3ML LL SCALE MARK (SYRINGE) IMPLANT
TOWEL GREEN STERILE (TOWEL DISPOSABLE) ×3 IMPLANT
TOWEL GREEN STERILE FF (TOWEL DISPOSABLE) ×3 IMPLANT
WATER STERILE IRR 1000ML POUR (IV SOLUTION) ×3 IMPLANT

## 2019-02-02 NOTE — H&P (Signed)
Surgical H&P Update  HPI: 43 y.o. woman with RLE radicular pain, here for surgical treatment. 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 No changes in health since she was last seen. Still having symptoms in the RLE and wishes to proceed with surgery. Lumbar adiposity too deep to allow for a minimally invasive approach.  PMHx:  Past Medical History:  Diagnosis Date  . Bronchitis   . Ectopic pregnancy   . Heart murmur    Diagnosed as a teenager ~ 16 during fever, no follow up required, no murmur on exam 01/28/19   FamHx: History reviewed. No pertinent family history. SocHx:  reports that she quit smoking about 6 months ago. Her smoking use included cigarettes. She has never used smokeless tobacco. She reports that she does not drink alcohol or use drugs.  Physical Exam: AOx3, PERRL, FS, TM  Strength 5/5 x4, SILTx4 except R S1 distribution numbness into the lateral R foot  Assesment/Plan: 43 y.o. woman with R S1 radiculopathy 2/2 R L5-S1 HNP, here for right L5-S1 open microdiscectomy. Risks, benefits, and alternatives discussed and the patient would like to continue with surgery.  -OR today -3C post-op  Judith Part, MD 02/02/19 7:13 AM

## 2019-02-02 NOTE — Anesthesia Procedure Notes (Signed)
Procedure Name: Intubation Date/Time: 02/02/2019 7:45 AM Performed by: Wilburn Cornelia, CRNA Pre-anesthesia Checklist: Patient identified, Emergency Drugs available, Suction available, Patient being monitored and Timeout performed Patient Re-evaluated:Patient Re-evaluated prior to induction Oxygen Delivery Method: Circle system utilized Preoxygenation: Pre-oxygenation with 100% oxygen Induction Type: IV induction Ventilation: Mask ventilation without difficulty Laryngoscope Size: Mac and 3 Grade View: Grade I Tube type: Oral Tube size: 7.0 mm Number of attempts: 1 Airway Equipment and Method: Stylet Placement Confirmation: ETT inserted through vocal cords under direct vision,  positive ETCO2,  CO2 detector and breath sounds checked- equal and bilateral Secured at: 22 cm Tube secured with: Tape Dental Injury: Teeth and Oropharynx as per pre-operative assessment

## 2019-02-02 NOTE — Anesthesia Postprocedure Evaluation (Signed)
Anesthesia Post Note  Patient: Kristina Calhoun  Procedure(s) Performed: Right Lumbar Five Sacral One Open Microdiscectomy (Right Spine Lumbar)     Patient location during evaluation: PACU Anesthesia Type: General Level of consciousness: awake and alert Pain management: pain level controlled Vital Signs Assessment: post-procedure vital signs reviewed and stable Respiratory status: spontaneous breathing, nonlabored ventilation, respiratory function stable and patient connected to nasal cannula oxygen Cardiovascular status: blood pressure returned to baseline and stable Postop Assessment: no apparent nausea or vomiting Anesthetic complications: no    Last Vitals:  Vitals:   02/02/19 1605 02/02/19 1922  BP: 138/71 (!) 151/84  Pulse: 69 72  Resp: 18 20  Temp: 36.8 C (!) 36.3 C  SpO2: 99% 98%    Last Pain:  Vitals:   02/02/19 1926  TempSrc:   PainSc: 3                  Ormond Lazo COKER

## 2019-02-02 NOTE — Op Note (Signed)
PATIENT: Kristina Calhoun  DAY OF SURGERY: 02/02/19   PRE-OPERATIVE DIAGNOSIS:  Lumbar radiculopathy   POST-OPERATIVE DIAGNOSIS:  Lumbar radiculopathy   PROCEDURE:  Right L5-S1 lumbar microdiscectomy   SURGEON:  Surgeon(s) and Role:    Judith Part, MD - Primary    Kary Kos, MD - Assisting   ANESTHESIA: ETGA   BRIEF HISTORY: This is a 43 year old woman who presented with medically refractive right lower extremity S1 distribution radicular pain. The patient was found to have a large disc herniation on the right at L5-S1. This was discussed with the patient as well as risks, benefits, and alternatives and wished to proceed with surgical treatment.   OPERATIVE DETAIL: The patient was taken to the operating room and placed on the OR table in the prone position. A formal time out was performed with two patient identifiers and confirmed the operative site. Anesthesia was induced by the anesthesia team. The operative site was marked, hair was clipped with surgical clippers, the area was then prepped and draped in a sterile fashion. A linear incision was placed in the midline of the lumbar spine over L5-S1. Soft tissues were dissected, retractors were placed, and subperiosteal dissection was performed on the right side of L5-S1. Fluoroscopy was used to confirm the correct level. A right L5 hemilaminotomy was created with a combination of high speed drill and rongeurs. The ligamentum flavum was resected and the thecal sac, traversing nerve root, and disc herniation were identified. The operating microscope was draped and introduced into the field. The traversing nerve root was retracted medially and an annulotomy was created followed by a discectomy with removal of any free fragments in the disc space. The course of the traversing nerve root was palpated and free of residual compression. Hemostasis was obtained and then confirmed and then each layer of the wound was copiously irrigated.  All  instrument and sponge counts were correct, the incision was then closed in layers. Particular attention was paid to reapproximation of the tattoo that was involved in the incision. The patient was then returned to anesthesia for emergence. No apparent complications at the completion of the procedure.   EBL:  2mL   DRAINS: none   SPECIMENS: none   Judith Part, MD 02/02/19 7:17 AM

## 2019-02-02 NOTE — Transfer of Care (Signed)
Immediate Anesthesia Transfer of Care Note  Patient: Kristina Calhoun  Procedure(s) Performed: Right Lumbar Five Sacral One Open Microdiscectomy (Right Spine Lumbar)  Patient Location: PACU  Anesthesia Type:General  Level of Consciousness: awake, alert  and oriented  Airway & Oxygen Therapy: Patient Spontanous Breathing and Patient connected to nasal cannula oxygen  Post-op Assessment: Report given to RN and Post -op Vital signs reviewed and stable  Post vital signs: Reviewed and stable  Last Vitals:  Vitals Value Taken Time  BP 168/105 02/02/19 0951  Temp    Pulse 94 02/02/19 0952  Resp 15 02/02/19 0952  SpO2 100 % 02/02/19 0952  Vitals shown include unvalidated device data.  Last Pain:  Vitals:   02/02/19 0641  TempSrc:   PainSc: 0-No pain         Complications: No apparent anesthesia complications

## 2019-02-02 NOTE — Evaluation (Signed)
Physical Therapy Evaluation Patient Details Name: Kristina Calhoun MRN: [id number] DOB: May 06, 1975 Today's Date: 02/02/2019   History of Present Illness  43yo female s/p R L5-S1 open microdiscectomy 02/02/19. PMH heart murmur, obesity  Clinical Impression  Patient received in bed with RN present, pleasant and willing to participate in PT. Verbally reviewed precautions prior to mobility, then able to perform all bed mobility, functional transfers with RW, and gait approximately 3102f with RW with S and VC for safety and technique to maintain precautions. Patient pleased with progress thus far and reports RW is helpful in managing R LE pain with gait. She was left in bed with all needs met, bed alarm active, and lunch ready. She will continue to benefit from skilled PT services in the acute setting, currently recommending skilled HHPT services moving forward.     Follow Up Recommendations Home health PT    Equipment Recommendations  Rolling walker with 5" wheels;3in1 (PT)    Recommendations for Other Services       Precautions / Restrictions Precautions Precautions: Back;Fall Precaution Booklet Issued: No(verbally reviewed precautions) Precaution Comments: no brace needed per order set Restrictions Weight Bearing Restrictions: No      Mobility  Bed Mobility Overal bed mobility: Needs Assistance Bed Mobility: Rolling;Sidelying to Sit;Sit to Sidelying Rolling: Supervision Sidelying to sit: Supervision     Sit to sidelying: Supervision General bed mobility comments: S for all bed mobility, VC for technique  Transfers Overall transfer level: Needs assistance Equipment used: Rolling walker (2 wheeled) Transfers: Sit to/from Stand Sit to Stand: Supervision         General transfer comment: S for safety, VC for technique  Ambulation/Gait Ambulation/Gait assistance: Supervision Gait Distance (Feet): 300 Feet Assistive device: Rolling walker (2 wheeled) Gait  Pattern/deviations: Step-through pattern;Decreased step length - left;Decreased stance time - right;Antalgic Gait velocity: decreased   General Gait Details: slow but steady with RW, reports increased pain relief with mobiltiy with RW  Stairs            Wheelchair Mobility    Modified Rankin (Stroke Patients Only)       Balance Overall balance assessment: No apparent balance deficits (not formally assessed)                                           Pertinent Vitals/Pain Pain Assessment: 0-10 Pain Score: 8  Pain Location: R LE Pain Descriptors / Indicators: Aching;Burning;Sharp Pain Intervention(s): Limited activity within patient's tolerance;Monitored during session    Home Living Family/patient expects to be discharged to:: Private residence Living Arrangements: Alone Available Help at Discharge: Family;Available 24 hours/day Type of Home: House Home Access: Level entry     Home Layout: One level Home Equipment: None      Prior Function Level of Independence: Independent               Hand Dominance        Extremity/Trunk Assessment   Upper Extremity Assessment Upper Extremity Assessment: Defer to OT evaluation    Lower Extremity Assessment Lower Extremity Assessment: Overall WFL for tasks assessed    Cervical / Trunk Assessment Cervical / Trunk Assessment: Normal;Other exceptions Cervical / Trunk Exceptions: s/p back surgery  Communication   Communication: No difficulties  Cognition Arousal/Alertness: Awake/alert Behavior During Therapy: WFL for tasks assessed/performed Overall Cognitive Status: Within Functional Limits for tasks assessed  General Comments      Exercises     Assessment/Plan    PT Assessment Patient needs continued PT services  PT Problem List Decreased mobility;Decreased safety awareness;Decreased coordination;Decreased knowledge of  precautions;Obesity;Pain;Decreased knowledge of use of DME       PT Treatment Interventions DME instruction;Therapeutic activities;Gait training;Therapeutic exercise;Patient/family education;Functional mobility training    PT Goals (Current goals can be found in the Care Plan section)  Acute Rehab PT Goals Patient Stated Goal: go home, less pain PT Goal Formulation: With patient Time For Goal Achievement: 02/16/19 Potential to Achieve Goals: Good    Frequency Min 5X/week   Barriers to discharge        Co-evaluation               AM-PAC PT "6 Clicks" Mobility  Outcome Measure Help needed turning from your back to your side while in a flat bed without using bedrails?: A Little Help needed moving from lying on your back to sitting on the side of a flat bed without using bedrails?: A Little Help needed moving to and from a bed to a chair (including a wheelchair)?: A Little Help needed standing up from a chair using your arms (e.g., wheelchair or bedside chair)?: A Little Help needed to walk in hospital room?: A Little Help needed climbing 3-5 steps with a railing? : A Little 6 Click Score: 18    End of Session   Activity Tolerance: Patient tolerated treatment well Patient left: in bed;with call bell/phone within reach;with bed alarm set Nurse Communication: Mobility status PT Visit Diagnosis: Pain;Difficulty in walking, not elsewhere classified (R26.2) Pain - Right/Left: (back pain/R LE pain) Pain - part of body: (back pain/R LE pain)    Time: 1310-1335 PT Time Calculation (min) (ACUTE ONLY): 25 min   Charges:   PT Evaluation $PT Eval Low Complexity: 1 Low PT Treatments $Gait Training: 8-22 mins        Deniece Ree PT, DPT, CBIS  Supplemental Physical Therapist [place]    Pager [phone] Acute Rehab Office [phone]

## 2019-02-02 NOTE — Brief Op Note (Signed)
02/02/2019  8:82 AM  PATIENT:  Kristina Calhoun  43 y.o. female  PRE-OPERATIVE DIAGNOSIS:  Lumbar radiculopathy  POST-OPERATIVE DIAGNOSIS:  Lumbar radiculopathy  PROCEDURE:  Procedure(s) with comments: Right Lumbar Five Sacral One Open Microdiscectomy (Right) - Right Lumbar Five Sacral One Open Microdiscectomy  SURGEON:  Surgeon(s) and Role:    * Judith Part, MD - Primary    * Kary Kos, MD - Assisting  PHYSICIAN ASSISTANT:   ANESTHESIA:   general  EBL:  50 mL   BLOOD ADMINISTERED:none  DRAINS: none   LOCAL MEDICATIONS USED:  LIDOCAINE   SPECIMEN:  No Specimen  DISPOSITION OF SPECIMEN:  N/A  COUNTS:  YES  TOURNIQUET:  * No tourniquets in log *  DICTATION: .Note written in EPIC  PLAN OF CARE: Admit for overnight observation  PATIENT DISPOSITION:  PACU - hemodynamically stable.   Delay start of Pharmacological VTE agent (>24hrs) due to surgical blood loss or risk of bleeding: yes

## 2019-02-02 NOTE — Anesthesia Preprocedure Evaluation (Signed)
Anesthesia Evaluation  Patient identified by MRN, date of birth, ID band Patient awake    Reviewed: Allergy & Precautions, NPO status , Patient's Chart, lab work & pertinent test results  Airway Mallampati: II  TM Distance: >3 FB Neck ROM: Full    Dental  (+) Teeth Intact, Dental Advisory Given   Pulmonary former smoker,    breath sounds clear to auscultation       Cardiovascular  Rhythm:Regular Rate:Normal     Neuro/Psych    GI/Hepatic   Endo/Other    Renal/GU      Musculoskeletal   Abdominal (+) + obese,   Peds  Hematology   Anesthesia Other Findings   Reproductive/Obstetrics                            Anesthesia Physical Anesthesia Plan  ASA: III  Anesthesia Plan: General   Post-op Pain Management:    Induction: Intravenous  PONV Risk Score and Plan: Ondansetron and Dexamethasone  Airway Management Planned: Oral ETT  Additional Equipment:   Intra-op Plan:   Post-operative Plan: Extubation in OR  Informed Consent: I have reviewed the patients History and Physical, chart, labs and discussed the procedure including the risks, benefits and alternatives for the proposed anesthesia with the patient or authorized representative who has indicated his/her understanding and acceptance.     Dental advisory given  Plan Discussed with: CRNA and Anesthesiologist  Anesthesia Plan Comments:         Anesthesia Quick Evaluation  

## 2019-02-02 NOTE — Anesthesia Postprocedure Evaluation (Signed)
Anesthesia Post Note  Patient: Kristina Calhoun  Procedure(s) Performed: Right Lumbar Five Sacral One Open Microdiscectomy (Right Spine Lumbar)     Patient location during evaluation: PACU Anesthesia Type: General Level of consciousness: awake and alert Pain management: pain level controlled Vital Signs Assessment: post-procedure vital signs reviewed and stable Respiratory status: spontaneous breathing, nonlabored ventilation, respiratory function stable and patient connected to nasal cannula oxygen Cardiovascular status: blood pressure returned to baseline and stable Postop Assessment: no apparent nausea or vomiting Anesthetic complications: no    Last Vitals:  Vitals:   02/02/19 1014 02/02/19 1018  BP:  (!) 161/101  Pulse: 80 71  Resp: 15 18  Temp:    SpO2: 100% 100%    Last Pain:  Vitals:   02/02/19 0641  TempSrc:   PainSc: 0-No pain      LLE Sensation: Full sensation (02/02/19 1020)   RLE Sensation: Full sensation (02/02/19 1020)      Demont Linford COKER

## 2019-02-03 ENCOUNTER — Encounter (HOSPITAL_COMMUNITY): Payer: Self-pay | Admitting: Neurological Surgery

## 2019-02-03 DIAGNOSIS — M5416 Radiculopathy, lumbar region: Secondary | ICD-10-CM | POA: Diagnosis not present

## 2019-02-03 NOTE — Discharge Summary (Signed)
Discharge Summary  Date of Admission: 02/02/2019  Date of Discharge: 02/03/19  Attending Physician: Emelda Brothers, MD  Hospital Course: Patient was admitted for overnight observation following an uncomplicated open W4-Y6 microdiscectomy. She was recovered in PACU and transferred to [facility]. Her hospital course was uncomplicated and the patient was discharged home on 02/03/2019. She will follow up in clinic with me in 2 weeks.  Neurologic exam at discharge:  AOx3, PERRL, EOMI, FS, TM Strength 5/5 x4, SILTx4  Discharge diagnosis: Lumbar radiculopathy  Judith Part, MD 02/03/19 7:38 AM

## 2019-02-03 NOTE — Progress Notes (Signed)
Occupational Therapy Evaluation Patient Details Name: Kristina Calhoun MRN: [id number] DOB: May 11, 1975 Today's Date: 02/03/2019    History of Present Illness 43 yo female s/p R L5-S1 open microdiscectomy 02/02/19. PMH heart murmur, obesity   Clinical Impression   PTA, pt lived in one level home with children, and was independent with ADLs, IADLs, and driving. Pt currently presents with decreased strength, activity tolerance, knowledge of use of AE and DME, and increased pain. Provided education on AE and compensatory strategies for ADLs. Pt performed dressing and simulated toilet transfer with supervision, and simulated tub transfer with min guard A for safety and balance. Recommend dc home with no OT follow up. All education and acute needs met, will sign off.     Follow Up Recommendations  No OT follow up    Equipment Recommendations  3 in 1 bedside commode    Recommendations for Other Services PT consult     Precautions / Restrictions Precautions Precautions: Back;Fall Precaution Booklet Issued: Yes (comment)(recalled 3/3 precautions) Precaution Comments: no brace needed per order set Restrictions Weight Bearing Restrictions: No      Mobility Bed Mobility Overal bed mobility: Modified Independent Bed Mobility: Rolling;Sit to Sidelying Rolling: Modified independent (Device/Increase time)       Sit to sidelying: Supervision General bed mobility comments: Pt insisting on HOB elevated and use of railing for ease of transfer. Feel she could likely complete with HOB flat but would like pt to practice how she will have to do at home before d/c.   Transfers Overall transfer level: Needs assistance Equipment used: None Transfers: Sit to/from Stand Sit to Stand: Supervision         General transfer comment: Increased time to power-up to full stand. Somewhat of an uncontrolled descent to EOB at end of session.     Balance Overall balance assessment: No apparent balance  deficits (not formally assessed)                                         ADL either performed or assessed with clinical judgement   ADL Overall ADL's : Needs assistance/impaired Eating/Feeding: Independent;Sitting   Grooming: Modified independent;Standing   Upper Body Bathing: Modified independent;Sitting   Lower Body Bathing: Supervison/ safety;With adaptive equipment;Sit to/from stand   Upper Body Dressing : Modified independent;Standing Upper Body Dressing Details (indicate cue type and reason): pt donned UB clothing with mod I in standing Lower Body Dressing: Supervision/safety;With adaptive equipment;Sit to/from stand Lower Body Dressing Details (indicate cue type and reason): Provided education for AE and compensatory strategies for LB dressing. pt unable to demonstrate figure 4 method, used AE with supervision Toilet Transfer: Supervision/safety;Ambulation(simulated to recliner) Toilet Transfer Details (indicate cue type and reason): simulated to recliner. pt required supervision for safety Toileting- Clothing Manipulation and Hygiene: Supervision/safety;Sit to/from stand   Tub/ Shower Transfer: Tub transfer;Min guard;Ambulation Tub/Shower Transfer Details (indicate cue type and reason): provided education for compensatory strategy for side stepping over tub. Pt performed simlated tub transfer with min guard A for safety and balance. Functional mobility during ADLs: Supervision/safety General ADL Comments: performed some ADLs with mod I, LB ADLs with supervision, and tub transfer with min guard A for safety and balance     Vision Baseline Vision/History: Wears glasses(contacts) Wears Glasses: At all times Patient Visual Report: No change from baseline       Perception     Praxis  Pertinent Vitals/Pain Pain Assessment: Faces Faces Pain Scale: Hurts little more Pain Location: back Pain Descriptors / Indicators:  Aching;Discomfort;Grimacing;Sore Pain Intervention(s): Monitored during session;Repositioned     Hand Dominance Right   Extremity/Trunk Assessment Upper Extremity Assessment Upper Extremity Assessment: Overall WFL for tasks assessed   Lower Extremity Assessment Lower Extremity Assessment: Defer to PT evaluation   Cervical / Trunk Assessment Cervical / Trunk Assessment: Normal;Other exceptions Cervical / Trunk Exceptions: s/p back surgery   Communication Communication Communication: No difficulties   Cognition Arousal/Alertness: Awake/alert Behavior During Therapy: WFL for tasks assessed/performed Overall Cognitive Status: Within Functional Limits for tasks assessed                                     General Comments       Exercises     Shoulder Instructions      Home Living Family/patient expects to be discharged to:: Private residence Living Arrangements: Children Available Help at Discharge: Family;Available 24 hours/day Type of Home: House Home Access: Level entry     Home Layout: One level     Bathroom Shower/Tub: Teacher, early years/pre: Standard     Home Equipment: Environmental consultant - 4 wheels   Additional Comments: Pt reports low toilet, difficulty getting up and down      Prior Functioning/Environment Level of Independence: Independent        Comments: driving        OT Problem List: Decreased strength;Decreased activity tolerance;Decreased safety awareness;Decreased knowledge of use of DME or AE;Decreased knowledge of precautions;Pain      OT Treatment/Interventions:      OT Goals(Current goals can be found in the care plan section) Acute Rehab OT Goals Patient Stated Goal: go home, less pain OT Goal Formulation: All assessment and education complete, DC therapy  OT Frequency:     Barriers to D/C:            Co-evaluation              AM-PAC OT "6 Clicks" Daily Activity     Outcome Measure Help from  another person eating meals?: None Help from another person taking care of personal grooming?: None Help from another person toileting, which includes using toliet, bedpan, or urinal?: None Help from another person bathing (including washing, rinsing, drying)?: None Help from another person to put on and taking off regular upper body clothing?: None Help from another person to put on and taking off regular lower body clothing?: A Little 6 Click Score: 23   End of Session Nurse Communication: Mobility status  Activity Tolerance: Patient tolerated treatment well Patient left: in chair;with call bell/phone within reach  OT Visit Diagnosis: Muscle weakness (generalized) (M62.81);Pain Pain - part of body: (back )                Time: 1027-2536 OT Time Calculation (min): 31 min Charges:  OT General Charges $OT Visit: 1 Visit OT Evaluation $OT Eval Low Complexity: 1 Low OT Treatments $Self Care/Home Management : 8-22 mins  Gus Rankin, OT Student  Gus Rankin 02/03/2019, 9:41 AM

## 2019-02-03 NOTE — Plan of Care (Signed)
Patient alert and oriented, mae's well, voiding adequate amount of urine, swallowing without difficulty, no c/o pain at time of discharge. Patient discharged home with family. Script and discharged instructions given to patient. Patient and family stated understanding of instructions given. Patient has an appointment with Dr. Ostergard   

## 2019-02-03 NOTE — Progress Notes (Signed)
Neurosurgery Service Progress Note  Subjective: No acute events overnight, no leg pain / numbness, some back soreness   Objective: Vitals:   02/02/19 1605 02/02/19 1922 02/02/19 2252 02/03/19 0321  BP: 138/71 (!) 151/84 134/84 123/80  Pulse: 69 72 73 82  Resp: 18 20 20 20   Temp: 98.2 F (36.8 C) (!) 97.4 F (36.3 C) 97.9 F (36.6 C) 98.1 F (36.7 C)  TempSrc: Oral Oral Oral Oral  SpO2: 99% 98% 99% 99%  Weight:      Height:       Temp (24hrs), Avg:97.8 F (36.6 C), Min:97 F (36.1 C), Max:98.3 F (36.8 C)  CBC Latest Ref Rng & Units 01/28/2019 11/15/2017  WBC 4.0 - 10.5 K/uL 9.0 10.0  Hemoglobin 12.0 - 15.0 g/dL 10.9(L) 12.2  Hematocrit 36.0 - 46.0 % 34.7(L) 38.5  Platelets 150 - 400 K/uL 304 280   BMP Latest Ref Rng & Units 11/15/2017  Glucose 70 - 99 mg/dL 100(H)  BUN 6 - 20 mg/dL <5(L)  Creatinine 0.44 - 1.00 mg/dL 0.80  Sodium 135 - 145 mmol/L 138  Potassium 3.5 - 5.1 mmol/L 3.6  Chloride 98 - 111 mmol/L 105  CO2 22 - 32 mmol/L 24  Calcium 8.9 - 10.3 mg/dL 8.9    Intake/Output Summary (Last 24 hours) at 02/03/2019 0737 Last data filed at 02/02/2019 1213 Gross per 24 hour  Intake 940 ml  Output 50 ml  Net 890 ml    Current Facility-Administered Medications:  .  0.9 %  sodium chloride infusion, 250 mL, Intravenous, Continuous, Ostergard, Thomas A, MD .  acetaminophen (TYLENOL) tablet 650 mg, 650 mg, Oral, Q4H PRN **OR** acetaminophen (TYLENOL) suppository 650 mg, 650 mg, Rectal, Q4H PRN, Ostergard, Thomas A, MD .  docusate sodium (COLACE) capsule 100 mg, 100 mg, Oral, BID, Judith Part, MD, 100 mg at 02/02/19 1951 .  famotidine (PEPCID) tablet 20 mg, 20 mg, Oral, Daily PRN, Judith Part, MD .  HYDROmorphone (DILAUDID) injection 1 mg, 1 mg, Intravenous, Q3H PRN, Judith Part, MD, 1 mg at 02/02/19 1214 .  menthol-cetylpyridinium (CEPACOL) lozenge 3 mg, 1 lozenge, Oral, PRN, 3 mg at 02/02/19 2308 **OR** phenol (CHLORASEPTIC) mouth spray 1 spray,  1 spray, Mouth/Throat, PRN, Ostergard, Thomas A, MD .  ondansetron (ZOFRAN) tablet 4 mg, 4 mg, Oral, Q6H PRN **OR** ondansetron (ZOFRAN) injection 4 mg, 4 mg, Intravenous, Q6H PRN, Ostergard, Thomas A, MD .  oxyCODONE (Oxy IR/ROXICODONE) immediate release tablet 10 mg, 10 mg, Oral, Q4H PRN, Judith Part, MD, 10 mg at 02/03/19 2263 .  oxyCODONE (Oxy IR/ROXICODONE) immediate release tablet 5 mg, 5 mg, Oral, Q4H PRN, Ostergard, Thomas A, MD .  polyethylene glycol (MIRALAX / GLYCOLAX) packet 17 g, 17 g, Oral, Daily PRN, Ostergard, Thomas A, MD .  sodium chloride flush (NS) 0.9 % injection 3 mL, 3 mL, Intravenous, Q12H, Ostergard, Joyice Faster, MD, 3 mL at 02/02/19 1954 .  sodium chloride flush (NS) 0.9 % injection 3 mL, 3 mL, Intravenous, PRN, Ostergard, Thomas A, MD .  tiZANidine (ZANAFLEX) tablet 4 mg, 4 mg, Oral, TID PRN, Judith Part, MD, 4 mg at 02/03/19 0608   Physical Exam: AOx3, PERRL, EOMI, FS, Strength 5/5 x4, SILTx4 Incision c/d/i  Assessment & Plan: 43 y.o. woman s/p open L5-S1 microdiscectomy, recovering well. -discharge home today  Judith Part  02/03/19 7:37 AM

## 2019-02-03 NOTE — Progress Notes (Signed)
Physical Therapy Treatment Patient Details Name: Kristina Calhoun MRN: [id number] DOB: [birth year] Today's Date: 02/03/2019    History of Present Illness 43 yo female s/p R L5-S1 open microdiscectomy 02/02/19. PMH heart murmur, obesity    PT Comments    Pt progressing towards physical therapy goals. Was able to progress to no AD this session and ambulated ~350 feet in hall with no overt LOB noted. We reviewed precautions, car transfer, activity progression and positioning recommendations. Will continue to follow and progress as able per POC.     Follow Up Recommendations  Home health PT     Equipment Recommendations  Rolling walker with 5" wheels;3in1 (PT)    Recommendations for Other Services       Precautions / Restrictions Precautions Precautions: Back;Fall Precaution Booklet Issued: Yes (comment)(recalled 3/3 precautions) Precaution Comments: no brace needed per order set Restrictions Weight Bearing Restrictions: No    Mobility  Bed Mobility Overal bed mobility: Modified Independent Bed Mobility: Rolling;Sit to Sidelying Rolling: Modified independent (Device/Increase time)       Sit to sidelying: Supervision General bed mobility comments: Pt insisting on HOB elevated and use of railing for ease of transfer. Feel she could likely complete with HOB flat but would like pt to practice how she will have to do at home before d/c.   Transfers Overall transfer level: Needs assistance Equipment used: None Transfers: Sit to/from Stand Sit to Stand: Supervision         General transfer comment: Increased time to power-up to full stand. Somewhat of an uncontrolled descent to EOB at end of session.   Ambulation/Gait Ambulation/Gait assistance: Supervision Gait Distance (Feet): 350 Feet Assistive device: None Gait Pattern/deviations: Step-through pattern;Decreased step length - left;Decreased stance time - right;Wide base of support Gait velocity: decreased Gait velocity  interpretation: <1.8 ft/sec, indicate of risk for recurrent falls General Gait Details: Slow but steady. No AD this session and noted wide BOS.    Stairs             Wheelchair Mobility    Modified Rankin (Stroke Patients Only)       Balance Overall balance assessment: No apparent balance deficits (not formally assessed)                                          Cognition Arousal/Alertness: Awake/alert Behavior During Therapy: WFL for tasks assessed/performed Overall Cognitive Status: Within Functional Limits for tasks assessed                                        Exercises      General Comments        Pertinent Vitals/Pain Pain Assessment: Faces Faces Pain Scale: Hurts little more Pain Location: back Pain Descriptors / Indicators: Aching;Discomfort;Grimacing;Sore Pain Intervention(s): Monitored during session;Repositioned    Home Living Family/patient expects to be discharged to:: Private residence Living Arrangements: Children Available Help at Discharge: Family;Available 24 hours/day Type of Home: House Home Access: Level entry   Home Layout: One level Home Equipment: Walker - 4 wheels Additional Comments: Pt reports low toilet, difficulty getting up and down    Prior Function Level of Independence: Independent      Comments: driving   PT Goals (current goals can now be found in the care plan section) Acute Rehab PT  Goals Patient Stated Goal: go home, less pain PT Goal Formulation: With patient Time For Goal Achievement: 02/16/19 Potential to Achieve Goals: Good Progress towards PT goals: Progressing toward goals    Frequency    Min 5X/week      PT Plan Current plan remains appropriate    Co-evaluation              AM-PAC PT "6 Clicks" Mobility   Outcome Measure  Help needed turning from your back to your side while in a flat bed without using bedrails?: A Little Help needed moving from  lying on your back to sitting on the side of a flat bed without using bedrails?: A Little Help needed moving to and from a bed to a chair (including a wheelchair)?: A Little Help needed standing up from a chair using your arms (e.g., wheelchair or bedside chair)?: A Little Help needed to walk in hospital room?: A Little Help needed climbing 3-5 steps with a railing? : A Little 6 Click Score: 18    End of Session   Activity Tolerance: Patient tolerated treatment well Patient left: in bed;with call bell/phone within reach Nurse Communication: Mobility status PT Visit Diagnosis: Pain;Difficulty in walking, not elsewhere classified (R26.2) Pain - Right/Left: (back pain/R LE pain) Pain - part of body: (back pain/R LE pain)     Time: 8756-4332 PT Time Calculation (min) (ACUTE ONLY): 18 min  Charges:  $Gait Training: 8-22 mins                     Conni Slipper, PT, DPT Acute Rehabilitation Services Pager: [phone] Office: [phone]    Kristina Calhoun 02/03/2019, 9:22 AM

## 2019-03-11 ENCOUNTER — Other Ambulatory Visit: Payer: Self-pay

## 2019-03-11 ENCOUNTER — Encounter (HOSPITAL_COMMUNITY): Payer: Self-pay

## 2019-03-11 ENCOUNTER — Ambulatory Visit (HOSPITAL_COMMUNITY)
Admission: EM | Admit: 2019-03-11 | Discharge: 2019-03-11 | Disposition: A | Discharge status: Home / Self Care | Payer: 59 | Attending: Urgent Care | Admitting: Urgent Care

## 2019-03-11 DIAGNOSIS — Z20828 Contact with and (suspected) exposure to other viral communicable diseases: Secondary | ICD-10-CM | POA: Insufficient documentation

## 2019-03-11 DIAGNOSIS — R432 Parageusia: Secondary | ICD-10-CM | POA: Insufficient documentation

## 2019-03-11 DIAGNOSIS — Z20822 Contact with and (suspected) exposure to covid-19: Secondary | ICD-10-CM

## 2019-03-11 NOTE — ED Provider Notes (Signed)
MRN: [id number] DOB: [birth year]  Subjective:   Kristina Calhoun is a 43 y.o. female presenting for 1 day hx of loss of sense of taste. Patient had possible COVID exposure over the weekend. Sx started thereafter. Has a daughter with asthma, wants to make sure she knows about COVID. Also has some exposure through her daughter's clients, works as a Field seismologist.   No current facility-administered medications for this encounter.   Current Outpatient Medications:  .  Cyanocobalamin (VITAMIN B-12 PO), Take 1 tablet by mouth daily. Gummies, Disp: , Rfl:  .  diclofenac (VOLTAREN) 75 MG EC tablet, Take 1 tablet (75 mg total) by mouth 2 (two) times daily as needed (pain.). Restart 1 week after surgery, Disp:  , Rfl:  .  famotidine (PEPCID) 20 MG tablet, Take 20 mg by mouth daily as needed., Disp: , Rfl:  .  Multiple Vitamins-Minerals (ADULT GUMMY PO), Take 1 tablet by mouth daily., Disp: , Rfl:  .  oxyCODONE (ROXICODONE) 5 MG immediate release tablet, Take 1 tablet (5 mg total) by mouth every 8 (eight) hours as needed., Disp: 20 tablet, Rfl: 0 .  tiZANidine (ZANAFLEX) 4 MG tablet, Take 4 mg by mouth 3 (three) times daily as needed for muscle spasms., Disp: , Rfl:    Allergies  Allergen Reactions  . Vicodin [Hydrocodone-Acetaminophen] Itching  . Sulfa Antibiotics Itching and Rash    Past Medical History:  Diagnosis Date  . Bronchitis   . Ectopic pregnancy   . Heart murmur    Diagnosed as a teenager ~ 16 during fever, no follow up required, no murmur on exam 01/28/19     Past Surgical History:  Procedure Laterality Date  . etopic pregnancy  ectopic surgery  . LUMBAR LAMINECTOMY/DECOMPRESSION MICRODISCECTOMY Right 02/02/2019   Procedure: Right Lumbar Five Sacral One Open Microdiscectomy;  Surgeon: Jadene Pierini, MD;  Location: [facility] OR;  Service: Neurosurgery;  Laterality: Right;  Right Lumbar Five Sacral One Open Microdiscectomy    Family History  Problem Relation Age of Onset  . Healthy  Mother   . COPD Father     Social History   Tobacco Use  . Smoking status: Former Smoker    Types: Cigarettes    Quit date: 07/29/2018    Years since quitting: 0.6  . Smokeless tobacco: Never Used  . Tobacco comment: uses E cigarettes  Substance Use Topics  . Alcohol use: Yes    Comment: weekly  . Drug use: No    ROS   Objective:   Vitals: BP (!) 149/105 (BP Location: Left Arm)   Pulse 83   Temp 98.9 F (37.2 C) (Oral)   Resp 16   SpO2 99%   Physical Exam Constitutional:      General: She is not in acute distress.    Appearance: Normal appearance. She is well-developed. She is obese. She is not ill-appearing, toxic-appearing or diaphoretic.  HENT:     Head: Normocephalic and atraumatic.     Nose: Nose normal.     Mouth/Throat:     Mouth: Mucous membranes are moist.  Eyes:     Extraocular Movements: Extraocular movements intact.     Pupils: Pupils are equal, round, and reactive to light.  Cardiovascular:     Rate and Rhythm: Normal rate and regular rhythm.     Pulses: Normal pulses.     Heart sounds: Normal heart sounds. No murmur. No friction rub. No gallop.   Pulmonary:     Effort: Pulmonary effort is  normal. No respiratory distress.     Breath sounds: Normal breath sounds. No stridor. No wheezing, rhonchi or rales.  Skin:    General: Skin is warm and dry.     Findings: No rash.  Neurological:     Mental Status: She is alert and oriented to person, place, and time.  Psychiatric:        Mood and Affect: Mood normal.        Behavior: Behavior normal.        Thought Content: Thought content normal.     Assessment and Plan :   1. Loss of taste   2. Exposure to COVID-19 virus     Counseled patient on nature of COVID-19 including modes of transmission, diagnostic testing, management and supportive care.  Counseled on medications used for symptomatic relief. COVID 19 testing is pending. Counseled patient on potential for adverse effects with medications  prescribed/recommended today, ER and return-to-clinic precautions discussed, patient verbalized understanding.    Jaynee Eagles, PA-C 03/11/19 1441

## 2019-03-11 NOTE — ED Triage Notes (Signed)
Patient presents to Urgent Care with complaints of loss of taste and smell since a few days ago. Patient reports her daughter is asthmatic and the pt wants to be tested for covid to know if she must quarantine.

## 2019-03-13 ENCOUNTER — Telehealth: Payer: Self-pay | Admitting: Emergency Medicine

## 2019-03-13 LAB — NOVEL CORONAVIRUS, NAA (HOSP ORDER, SEND-OUT TO REF LAB; TAT 18-24 HRS): SARS-CoV-2, NAA: DETECTED — AB

## 2019-03-13 NOTE — Telephone Encounter (Signed)
Positive covid detected on sample. Pt called and made aware, educated pt that its ten days from start of symptoms and 24 hours fever free before returning to normal.  Pt verbalized understanding and had all questions answered.

## 2019-06-01 ENCOUNTER — Ambulatory Visit: Payer: Self-pay

## 2019-06-01 ENCOUNTER — Other Ambulatory Visit: Payer: Self-pay

## 2019-06-01 ENCOUNTER — Ambulatory Visit (INDEPENDENT_AMBULATORY_CARE_PROVIDER_SITE_OTHER): Payer: 59 | Admitting: Orthopedic Surgery

## 2019-06-01 DIAGNOSIS — M25551 Pain in right hip: Secondary | ICD-10-CM

## 2019-06-01 MED ORDER — PREDNISONE 5 MG (21) PO TBPK: ORAL_TABLET | ORAL | 0 refills | Status: DC

## 2019-06-06 ENCOUNTER — Encounter: Payer: Self-pay | Admitting: Orthopedic Surgery

## 2019-06-06 NOTE — Progress Notes (Signed)
Office Visit Note   Patient: Kristina Calhoun           Date of Birth: [birth year]           MRN: [id number] Visit Date: 06/01/2019 Requested by: Julian Hy, PA-C [place] ,  [place] [id number] PCP: Julian Hy, PA-C  Subjective: Chief Complaint  Patient presents with  . Right Hip - Pain    HPI: Kristina Calhoun is a 44 y.o. female who presents to the office complaining of right lower extremity pain.  Patient notes that for the last 3 weeks she has had constant pain in her right lateral hip with radiation down her leg.  She localizes the pain to the lateral right hip and states that it travels to the posterior thigh then the posterior knee and then to the mid calf.  This pain wakes her up at night and is associated with a burning quality.  She denies any low back pain or groin pain.  She has tried ice which helps and heat would makes it worse.  She is also tried Aleve, BC powders, diclofenac, tizanidine, Advil with little relief.  She has a history of sciatica with a microdiscectomy at L5-S1 on 02/02/2019.  She has seen her spine surgeon who told her that he does not think it is coming from her back and instead is coming from her hip joint.  She is scheduled for hip injection on 06/19/2019.  Her last MRI was in 2017.  She has a history of taking oral prednisone courses to help with sciatic pain and states that a 7-day course never helps but the 12-day course does..                ROS:  All systems reviewed are negative as they relate to the chief complaint within the history of present illness.  Patient denies fevers or chills.  Assessment & Plan: Visit Diagnoses:  1. Pain in right hip     Plan: Patient is a 44 year old female who presents complaining of radicular right lower extremity pain.  She has a history of sciatica with 1 back surgery which was a microdiscectomy at L5-S1 according to her.  This was late last year.  Her symptoms of been ongoing for the  past 3 weeks with no relief.  Throughout the exam and interview she is posturing to take pressure off of her right buttocks.  Radiographs reveal loss of disc space at L5-S1 as well as a grade 1 spondylolisthesis at L4-L5.  However her hip x-rays look relatively normal with no arthritis present.  On exam she has no pain that is localized to the groin with any maneuver involving the hip joint.  However she does have a positive ipsilateral and contralateral straight leg raise that recreates her symptoms on the right side. impression is t her pain is referred from her back.  Prescribed a 12-day course of oral steroid to help with her immediate pain.  Also ordered an MRI with contrast of the lumbar spine to evaluate for right radiculopathy.  Hip examination and radiographs are negative  Follow-Up Instructions: No follow-ups on file.   Orders:  Orders Placed This Encounter  Procedures  . XR Lumbar Spine 2-3 Views  . XR HIP UNILAT W OR W/O PELVIS 2-3 VIEWS RIGHT  . MR LUMBAR SPINE W CONTRAST   Meds ordered this encounter  Medications  . predniSONE (STERAPRED UNI-PAK 21 TAB) 5 MG (21) TBPK tablet    Sig: Take dosepak  as directed    Dispense:  21 tablet    Refill:  0      Procedures: No procedures performed   Clinical Data: No additional findings.  Objective: Vital Signs: There were no vitals taken for this visit.  Physical Exam:  Constitutional: Patient appears well-developed HEENT:  Head: Normocephalic Eyes:EOM are normal Neck: Normal range of motion Cardiovascular: Normal rate Pulmonary/chest: Effort normal Neurologic: Patient is alert Skin: Skin is warm Psychiatric: Patient has normal mood and affect  Ortho Exam:  No significant tenderness to palpation throughout the axial lumbar spine or paraspinal musculature.  No pain with terminal hip flexion or external rotation/internal rotation of the hip joint.  Negative Stinchfield exam.  Positive straight leg raise of the right lower  extremity as well as positive contralateral straight leg raise of the lower extremity that recreates her right sided pain.  5/5 motor strength of the bilateral hip flexors, quadriceps, hamstring, dorsiflexion, plantarflexion.  Sensation intact through all dermatomes of the bilateral lower extremities.  Specialty Comments:  No specialty comments available.  Imaging: No results found.   PMFS History: Patient Active Problem List   Diagnosis Date Noted  . Lumbar radiculopathy 02/02/2019   Past Medical History:  Diagnosis Date  . Bronchitis   . Ectopic pregnancy   . Heart murmur    Diagnosed as a teenager ~ 16 during fever, no follow up required, no murmur on exam 01/28/19    Family History  Problem Relation Age of Onset  . Healthy Mother   . COPD Father     Past Surgical History:  Procedure Laterality Date  . etopic pregnancy  ectopic surgery  . LUMBAR LAMINECTOMY/DECOMPRESSION MICRODISCECTOMY Right 02/02/2019   Procedure: Right Lumbar Five Sacral One Open Microdiscectomy;  Surgeon: Jadene Pierini, MD;  Location: [facility] OR;  Service: Neurosurgery;  Laterality: Right;  Right Lumbar Five Sacral One Open Microdiscectomy   Social History   Occupational History  . Not on file  Tobacco Use  . Smoking status: Former Smoker    Types: Cigarettes    Quit date: 07/29/2018    Years since quitting: 0.8  . Smokeless tobacco: Never Used  . Tobacco comment: uses E cigarettes  Substance and Sexual Activity  . Alcohol use: Yes    Comment: weekly  . Drug use: No  . Sexual activity: Not on file

## 2019-06-07 ENCOUNTER — Encounter: Payer: Self-pay | Admitting: Orthopedic Surgery

## 2019-06-10 ENCOUNTER — Telehealth: Payer: Self-pay

## 2019-06-10 DIAGNOSIS — M25551 Pain in right hip: Secondary | ICD-10-CM

## 2019-06-10 NOTE — Telephone Encounter (Signed)
Patients insurance denied MRI scan.  She would like to try 6wks of conservative treatment. Referral made to PT upstairs. She will follow up with you in 6wks after she begins PT. She is asking if she can have a refill on medrol dosepak but is requesting a 12 day pak rather than 6 days.

## 2019-06-11 MED ORDER — PREDNISONE 5 MG (21) PO TBPK: ORAL_TABLET | ORAL | 0 refills | Status: DC

## 2019-06-11 NOTE — Addendum Note (Signed)
Addended byPrescott Parma on: 06/11/2019 02:13 PM   Modules accepted: Orders

## 2019-06-11 NOTE — Telephone Encounter (Signed)
Ok for 12 day pack

## 2019-06-19 ENCOUNTER — Ambulatory Visit: Payer: 59 | Admitting: Physical Therapy

## 2019-07-02 ENCOUNTER — Ambulatory Visit: Payer: 59 | Admitting: Rehabilitative and Restorative Service Providers"

## 2019-11-05 IMAGING — RF DG LUMBAR SPINE 2-3V
1 series · 1 of 1 positions shown · non-contrast
Comparison: None.

CLINICAL DATA: Localization for discectomy.

EXAM:
DG C-ARM 1-60 MIN; LUMBAR SPINE - 2-3 VIEW

[Series 1: run · 1 of 1 slices shown]
[im 1/1]
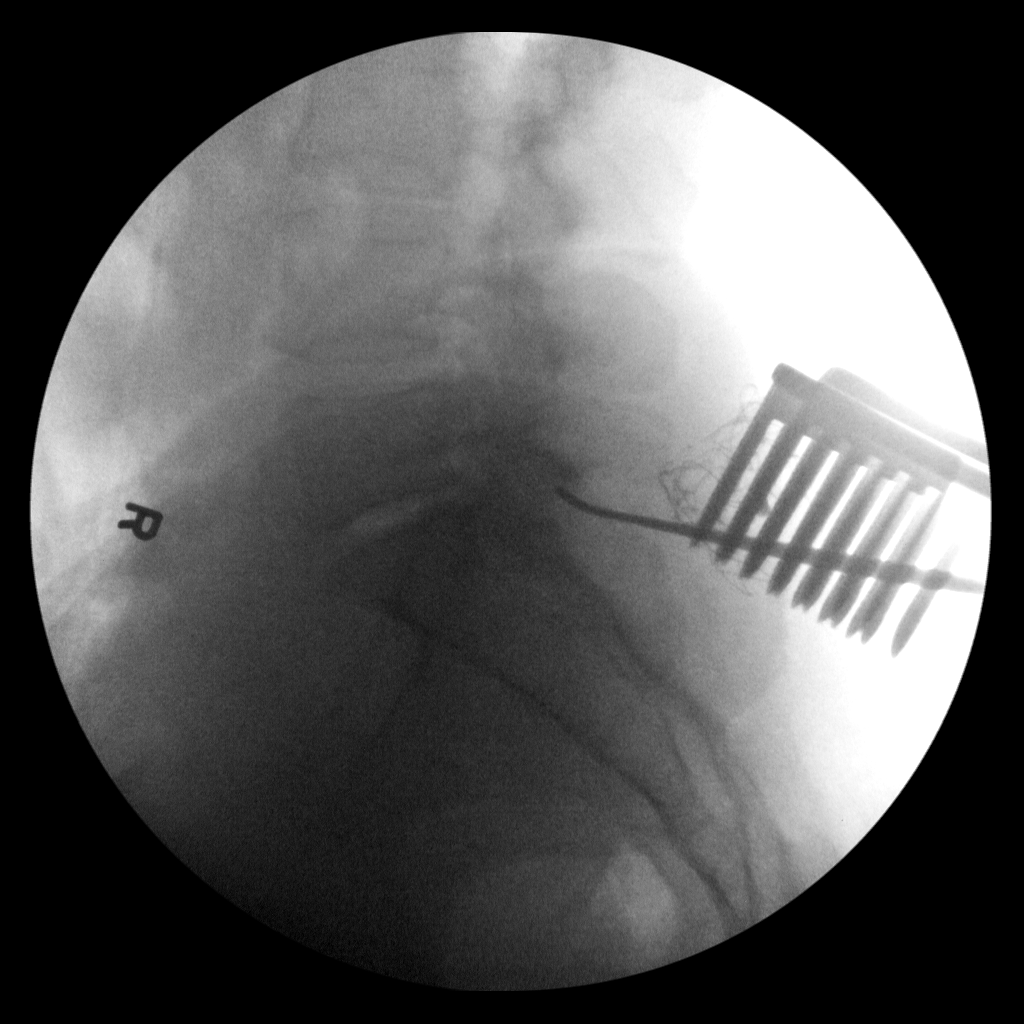

[1 of 1 positions shown; findings below may reference images not displayed]

FINDINGS: A single intraoperative view of the lumbar spine is submitted
postoperatively for interpretation.

A posterior metallic localizer is directed at the L5-S1 interspace.
IMPRESSION: Localizer

## 2020-01-06 ENCOUNTER — Ambulatory Visit (HOSPITAL_COMMUNITY)
Admission: EM | Admit: 2020-01-06 | Discharge: 2020-01-06 | Disposition: A | Discharge status: Home / Self Care | Payer: 59

## 2020-01-06 ENCOUNTER — Other Ambulatory Visit: Payer: Self-pay

## 2022-01-10 ENCOUNTER — Other Ambulatory Visit: Payer: Self-pay

## 2022-01-10 ENCOUNTER — Encounter (HOSPITAL_BASED_OUTPATIENT_CLINIC_OR_DEPARTMENT_OTHER): Payer: Self-pay | Admitting: Emergency Medicine

## 2022-01-10 DIAGNOSIS — M549 Dorsalgia, unspecified: Secondary | ICD-10-CM | POA: Insufficient documentation

## 2022-01-10 DIAGNOSIS — F17210 Nicotine dependence, cigarettes, uncomplicated: Secondary | ICD-10-CM | POA: Diagnosis not present

## 2022-01-10 DIAGNOSIS — Y92410 Unspecified street and highway as the place of occurrence of the external cause: Secondary | ICD-10-CM | POA: Diagnosis not present

## 2022-01-10 DIAGNOSIS — M25561 Pain in right knee: Secondary | ICD-10-CM | POA: Diagnosis present
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
## 2022-01-11 ENCOUNTER — Emergency Department (HOSPITAL_BASED_OUTPATIENT_CLINIC_OR_DEPARTMENT_OTHER): Payer: 59 | Admitting: Radiology

## 2022-01-11 ENCOUNTER — Emergency Department (HOSPITAL_BASED_OUTPATIENT_CLINIC_OR_DEPARTMENT_OTHER)
Admission: EM | Admit: 2022-01-11 | Discharge: 2022-01-11 | Disposition: A | Discharge status: Home / Self Care | Payer: 59 | Attending: Emergency Medicine | Admitting: Emergency Medicine

## 2022-01-11 DIAGNOSIS — M549 Dorsalgia, unspecified: Secondary | ICD-10-CM

## 2022-01-11 DIAGNOSIS — M25561 Pain in right knee: Secondary | ICD-10-CM

## 2022-01-11 LAB — PREGNANCY, URINE: Preg Test, Ur: NEGATIVE

## 2022-01-11 MED ORDER — NAPROXEN 250 MG PO TABS
500.0000 mg | ORAL_TABLET | Freq: Once | ORAL | Status: AC
  Administered 2022-01-11: 500 mg via ORAL
  Filled 2022-01-11: qty 2

## 2022-01-11 MED ORDER — NAPROXEN 500 MG PO TABS: 500.0000 mg | ORAL_TABLET | Freq: Two times a day (BID) | ORAL | 0 refills | Status: DC
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
## 2022-01-11 NOTE — ED Notes (Signed)
Reviewed AVS/discharge instruction with patient. Time allotted for and all questions answered. Patient is agreeable for d/c and escorted to ed exit by staff.  

## 2022-01-11 NOTE — Discharge Instructions (Addendum)
You were evaluated in the Emergency Department and after careful evaluation, we did not find any emergent condition requiring admission or further testing in the hospital.  Your exam/testing today is overall reassuring.  X-rays did not show any broken bones or significant injuries.  Recommend using the Naprosyn twice daily for pain.  Please return to the Emergency Department if you experience any worsening of your condition.   Thank you for allowing [place] to be a part of your care.

## 2022-01-19 ENCOUNTER — Other Ambulatory Visit: Payer: Self-pay | Admitting: Orthopedic Surgery

## 2022-01-19 DIAGNOSIS — M25561 Pain in right knee: Secondary | ICD-10-CM

## 2022-02-02 ENCOUNTER — Ambulatory Visit
Admission: RE | Admit: 2022-02-02 | Discharge: 2022-02-02 | Disposition: A | Discharge status: Home / Self Care | Payer: 59 | Source: Ambulatory Visit | Attending: Orthopedic Surgery | Admitting: Orthopedic Surgery

## 2022-02-02 DIAGNOSIS — M25561 Pain in right knee: Secondary | ICD-10-CM

## 2023-11-20 ENCOUNTER — Other Ambulatory Visit: Payer: Self-pay | Admitting: Medical Genetics

## 2023-11-28 ENCOUNTER — Other Ambulatory Visit: Payer: Self-pay

## 2023-11-28 ENCOUNTER — Encounter (HOSPITAL_BASED_OUTPATIENT_CLINIC_OR_DEPARTMENT_OTHER): Payer: Self-pay

## 2023-11-28 ENCOUNTER — Emergency Department (HOSPITAL_BASED_OUTPATIENT_CLINIC_OR_DEPARTMENT_OTHER)

## 2023-11-28 DIAGNOSIS — M79671 Pain in right foot: Secondary | ICD-10-CM | POA: Diagnosis present

## 2023-11-28 DIAGNOSIS — I10 Essential (primary) hypertension: Secondary | ICD-10-CM | POA: Diagnosis not present

## 2023-11-28 LAB — CBC WITH DIFFERENTIAL/PLATELET
Abs Immature Granulocytes: 0.02 K/uL (ref 0.00–0.07)
Basophils Absolute: 0.1 K/uL (ref 0.0–0.1)
Basophils Relative: 1 %
Eosinophils Absolute: 1.2 K/uL — ABNORMAL HIGH (ref 0.0–0.5)
Eosinophils Relative: 11 %
HCT: 40.9 % (ref 36.0–46.0)
Hemoglobin: 13.4 g/dL (ref 12.0–15.0)
Immature Granulocytes: 0 %
Lymphocytes Relative: 46 %
Lymphs Abs: 4.9 K/uL — ABNORMAL HIGH (ref 0.7–4.0)
MCH: 27.9 pg (ref 26.0–34.0)
MCHC: 32.8 g/dL (ref 30.0–36.0)
MCV: 85.2 fL (ref 80.0–100.0)
Monocytes Absolute: 0.6 K/uL (ref 0.1–1.0)
Monocytes Relative: 6 %
Neutro Abs: 3.8 K/uL (ref 1.7–7.7)
Neutrophils Relative %: 36 %
Platelets: 251 K/uL (ref 150–400)
RBC: 4.8 MIL/uL (ref 3.87–5.11)
RDW: 15.2 % (ref 11.5–15.5)
WBC: 10.6 K/uL — ABNORMAL HIGH (ref 4.0–10.5)
nRBC: 0 % (ref 0.0–0.2)

## 2023-11-28 LAB — BASIC METABOLIC PANEL WITH GFR
Anion gap: 12 (ref 5–15)
BUN: 8 mg/dL (ref 6–20)
CO2: 26 mmol/L (ref 22–32)
Calcium: 10.3 mg/dL (ref 8.9–10.3)
Chloride: 105 mmol/L (ref 98–111)
Creatinine, Ser: 0.84 mg/dL (ref 0.44–1.00)
GFR, Estimated: >60 mL/min (ref >60)
Glucose, Bld: 104 mg/dL — ABNORMAL HIGH (ref 70–99)
Potassium: 3.8 mmol/L (ref 3.5–5.1)
Sodium: 143 mmol/L (ref 135–145)

## 2023-11-28 NOTE — ED Triage Notes (Addendum)
 Pt reports she is here today due to right foot pain. Pt reports she was recently moving and think she injured her foot.Pt later reports she was dx with hypertension but is not on meds for it. Pt bp is 197/122. Pt denies any cp,sob,blurred vision. Pt reports she is asymptomatic

## 2023-11-29 ENCOUNTER — Emergency Department (HOSPITAL_BASED_OUTPATIENT_CLINIC_OR_DEPARTMENT_OTHER)
Admission: EM | Admit: 2023-11-29 | Discharge: 2023-11-29 | Disposition: A | Discharge status: Home / Self Care | Attending: Emergency Medicine | Admitting: Emergency Medicine

## 2023-11-29 DIAGNOSIS — I10 Essential (primary) hypertension: Secondary | ICD-10-CM

## 2023-11-29 DIAGNOSIS — M79671 Pain in right foot: Secondary | ICD-10-CM

## 2023-11-29 MED ORDER — AMLODIPINE BESYLATE 10 MG PO TABS: 10.0000 mg | ORAL_TABLET | Freq: Every day | ORAL | 1 refills | Status: AC

## 2023-11-29 NOTE — ED Provider Notes (Signed)
 [place] EMERGENCY DEPARTMENT AT [facility] Provider Note   CSN: [id number] Arrival date & time: 11/28/23  2138     Patient presents with: Foot Pain and Hypertension   Kristina Calhoun is a 48 y.o. female.   Patient is a 48 year old female presenting with pain in her right foot.  She reports helping a friend move several days ago and moving heavy boxes.  She believes she was wearing uncomfortable shoes at the time because she has been having discomfort to the back of her heel since.  The pain became significantly worse today, so presents for evaluation.  While in triage she is also found to have a markedly elevated blood pressure.  Patient does have a history of hypertension, but has been off of amlodipine since changing jobs many months ago.       Prior to Admission medications   Medication Sig Start Date End Date Taking? Authorizing Provider  Cyanocobalamin (VITAMIN B-12 PO) Take 1 tablet by mouth daily. Gummies    [provider]  diclofenac  (VOLTAREN ) 75 MG EC tablet Take 1 tablet (75 mg total) by mouth 2 (two) times daily as needed (pain.). Restart 1 week after surgery 02/02/19   Cheryle Debby LABOR, MD  famotidine  (PEPCID ) 20 MG tablet Take 20 mg by mouth daily as needed. 09/01/18   [provider]  Multiple Vitamins-Minerals (ADULT GUMMY PO) Take 1 tablet by mouth daily.    [provider]  naproxen  (NAPROSYN ) 500 MG tablet Take 1 tablet (500 mg total) by mouth 2 (two) times daily. 01/11/22   Theadore Ozell HERO, MD  predniSONE  (STERAPRED UNI-PAK 21 TAB) 5 MG (21) TBPK tablet Take dosepak as directed 06/01/19   Addie Cordella Hamilton, MD  predniSONE  (STERAPRED UNI-PAK 21 TAB) 5 MG (21) TBPK tablet Take 12 day dose pak as directed 06/11/19   Addie Cordella Hamilton, MD  tiZANidine  (ZANAFLEX ) 4 MG tablet Take 4 mg by mouth 3 (three) times daily as needed for muscle spasms.    [provider]    Allergies: Vicodin [hydrocodone -acetaminophen ] and Sulfa  antibiotics    Review of Systems  All other systems reviewed and are negative.   Updated Vital Signs BP (!) 172/115   Pulse 62   Temp 98 F (36.7 C) (Oral)   Resp 18   SpO2 100%   Physical Exam Vitals and nursing note reviewed.  Constitutional:      General: She is not in acute distress.    Appearance: She is well-developed. She is not diaphoretic.  HENT:     Head: Normocephalic.  Pulmonary:     Effort: Pulmonary effort is normal.  Abdominal:     Tenderness: There is no abdominal tenderness.  Musculoskeletal:        General: Normal range of motion.     Cervical back: Normal range of motion and neck supple.     Comments: The patient's foot is grossly normal in appearance.  She does have some tenderness over the posterior aspect of the heel.  The Achilles tendon is intact and there is no calf tenderness or edema.  Skin:    General: Skin is warm and dry.  Neurological:     General: No focal deficit present.     Mental Status: She is alert and oriented to person, place, and time.     (all labs ordered are listed, but only abnormal results are displayed) Labs Reviewed  CBC WITH DIFFERENTIAL/PLATELET - Abnormal; Notable for the following components:  Result Value   WBC 10.6 (*)    Lymphs Abs 4.9 (*)    Eosinophils Absolute 1.2 (*)    All other components within normal limits  BASIC METABOLIC PANEL WITH GFR - Abnormal; Notable for the following components:   Glucose, Bld 104 (*)    All other components within normal limits    EKG: None  Radiology: DG Foot Complete Right Result Date: 11/28/2023 CLINICAL DATA:  Right foot pain. Possible injury during recent move. EXAM: RIGHT FOOT COMPLETE - 3+ VIEW COMPARISON:  01/28/2009 FINDINGS: Right foot appears intact. No evidence of acute fracture or dislocation. No focal bone lesion or bone destruction. 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 Small Achilles calcaneal spur is larger than on prior study. Otherwise  no change. Soft tissues are unremarkable. IMPRESSION: No acute bony abnormalities. Electronically Signed   By: Elsie Gravely M.D.   On: 11/28/2023 22:12     Procedures   Medications Ordered in the ED - No data to display                                  Medical Decision Making Amount and/or Complexity of Data Reviewed Labs: ordered. Radiology: ordered.   Patient presenting with right foot pain as described in the HPI.  Her x-rays are negative and showed no evidence for fracture or other abnormality.  I suspect soft tissue inflammation and will recommend NSAIDs, rest, and follow-up as needed.  As far as her blood pressure goes, it is now 172/115 and patient is asymptomatic.  She has been off of her blood pressure medicine for the past several months, so I will represcribe this for her.  She did have laboratory studies obtained in triage including CBC and basic metabolic panel, both of which are unremarkable.  Patient to monitor her blood pressures at home and follow-up with a primary doctor.     Final diagnoses:  None    ED Discharge Orders     None          Geroldine Berg, MD 11/29/23 [phone]

## 2023-11-29 NOTE — Discharge Instructions (Addendum)
 Begin taking amlodipine.  Take ibuprofen 600 mg every 6 hours as needed for pain.  Rest.  Follow-up with primary doctor if symptoms are not improving in the next week.

## 2024-02-17 ENCOUNTER — Other Ambulatory Visit: Payer: Self-pay | Admitting: Medical Genetics

## 2024-02-17 DIAGNOSIS — Z006 Encounter for examination for normal comparison and control in clinical research program: Secondary | ICD-10-CM

## 2024-02-28 ENCOUNTER — Ambulatory Visit (INDEPENDENT_AMBULATORY_CARE_PROVIDER_SITE_OTHER)

## 2024-02-28 ENCOUNTER — Ambulatory Visit (INDEPENDENT_AMBULATORY_CARE_PROVIDER_SITE_OTHER): Admitting: Podiatry

## 2024-02-28 ENCOUNTER — Encounter: Payer: Self-pay | Admitting: Podiatry

## 2024-02-28 DIAGNOSIS — M7661 Achilles tendinitis, right leg: Secondary | ICD-10-CM | POA: Diagnosis not present

## 2024-02-28 MED ORDER — MELOXICAM 15 MG PO TABS: 15.0000 mg | ORAL_TABLET | Freq: Every day | ORAL | 0 refills | Status: DC

## 2024-02-28 MED ORDER — METHYLPREDNISOLONE 4 MG PO TBPK: ORAL_TABLET | ORAL | 0 refills | Status: DC

## 2024-02-28 NOTE — Progress Notes (Signed)
  Subjective:  Patient ID: Kristina Calhoun, female    DOB: 1976-02-19,  MRN: [id number]  Chief Complaint  Patient presents with   Foot Pain    Right foot posterior heel pain. 3 pain at the present. Non diabetic. Ibuprofen for pain.    Discussed the use of AI scribe software for clinical note transcription with the patient, who gave verbal consent to proceed.  History of Present Illness Kristina Calhoun is a 48 year old female who presents with chronic pain in the back of her right heel.  The pain began in July after wearing inappropriate footwear during prolonged standing and walking while helping her sister move. It became severe after a long drive home, with noticeable swelling upon exiting the car. She uses ibuprofen, BC arthritis powder, and previous prednisone  rx for relief, with some improvement over time, though swelling episodes persist. 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 There is no pain in the left heel.      Objective:    Physical Exam MUSCULOSKELETAL: Mild swelling and tenderness at the right Achilles tendon insertion centrally and laterally. Normal muscle strength in both feet. Mild pain on dorsiflexion of the right foot. DP and PT pulses palpable 2/4 bilaterally.  Cap refill intact to the digits. Protective and light touch sensation intact. Pedal skin well-hydrated, normal skin texture and skin turgor.    No images are attached to the encounter.    Results RADIOLOGY Right foot x-ray: Calcaneal spur at the Achilles tendon insertion.  Mild to moderate bunion deformity with some joint space narrowing present.  Joint spaces otherwise preserved.  Normal osseous mineralization.   Assessment:   1. Achilles tendinitis of right lower extremity      Plan:  Patient was evaluated and treated and all questions answered.  Assessment and Plan Assessment & Plan Chronic right Achilles tendinitis Chronic  tendinitis with mild symptoms and heel spur, exacerbated by improper footwear. Rehab potential good. - Provide boot for immobilization, worn during the day except when driving. Plan to use for 4 weeks until re-evalutation - Use 1-2 heel lifts in boot and shoes, starting with two and reducing to one over 1-2 weeks. - Stretching regimen discussed at length with written instructions dispensed.  Perform 2-3 rounds of stretching a day with 2-3 sets of 15 repetitions per movement. - Consider Achilles stretching board or Equinus board for additional support. - Apply ice massage with frozen water bottle on heel. - Prescribe Medrol  Dosepak for short-term steroid treatment. - Prescribe meloxicam 15 mg once daily 30 tablets with food and water, avoid concurrent ibuprofen or other NSAIDs. - Re-evaluate in four weeks, consider physical therapy if symptoms persist.      Return in about 4 weeks (around 03/27/2024) for Achilles tendinitis .

## 2024-02-28 NOTE — Patient Instructions (Addendum)
 Achilles Tendinitis  with Rehab Achilles tendinitis is a disorder of the Achilles tendon. The Achilles tendon connects the large calf muscles (Gastrocnemius and Soleus) to the heel bone (calcaneus). This tendon is sometimes called the heel cord. It is important for pushing-off and standing on your toes and is important for walking, running, or jumping. Tendinitis is often caused by overuse and repetitive microtrauma. SYMPTOMS Pain, tenderness, swelling, warmth, and redness may occur over the Achilles tendon even at rest. Pain with pushing off, or flexing or extending the ankle. Pain that is worsened after or during activity. CAUSES  Overuse sometimes seen with rapid increase in exercise programs or in sports requiring running and jumping. Poor physical conditioning (strength and flexibility or endurance). Running sports, especially training running down hills. Inadequate warm-up before practice or play or failure to stretch before participation. Injury to the tendon. PREVENTION  Warm up and stretch before practice or competition. Allow time for adequate rest and recovery between practices and competition. Keep up conditioning. Keep up ankle and leg flexibility. Improve or keep muscle strength and endurance. Improve cardiovascular fitness. Use proper technique. Use proper equipment (shoes, skates). To help prevent recurrence, taping, protective strapping, or an adhesive bandage may be recommended for several weeks after healing is complete. PROGNOSIS  Recovery may take weeks to several months to heal. Longer recovery is expected if symptoms have been prolonged. Recovery is usually quicker if the inflammation is due to a direct blow as compared with overuse or sudden strain. RELATED COMPLICATIONS  Healing time will be prolonged if the condition is not correctly treated. The injury must be given plenty of time to heal. Symptoms can reoccur if activity is resumed too soon. Untreated,  tendinitis may increase the risk of tendon rupture requiring additional time for recovery and possibly surgery. TREATMENT  The first treatment consists of rest anti-inflammatory medication, and ice to relieve the pain. Stretching and strengthening exercises after resolution of pain will likely help reduce the risk of recurrence. Referral to a physical therapist or athletic trainer for further evaluation and treatment may be helpful. A walking boot or cast may be recommended to rest the Achilles tendon. This can help break the cycle of inflammation and microtrauma. Arch supports (orthotics) may be prescribed or recommended by your caregiver as an adjunct to therapy and rest. Surgery to remove the inflamed tendon lining or degenerated tendon tissue is rarely necessary and has shown less than predictable results. MEDICATION  Nonsteroidal anti-inflammatory medications, such as aspirin and ibuprofen, may be used for pain and inflammation relief. Do not take within 7 days before surgery. Take these as directed by your caregiver. Contact your caregiver immediately if any bleeding, stomach upset, or signs of allergic reaction occur. Other minor pain relievers, such as acetaminophen , may also be used. Pain relievers may be prescribed as necessary by your caregiver. Do not take prescription pain medication for longer than 4 to 7 days. Use only as directed and only as much as you need. Cortisone injections are rarely indicated. Cortisone injections may weaken tendons and predispose to rupture. It is better to give the condition more time to heal than to use them. HEAT AND COLD Cold is used to relieve pain and reduce inflammation for acute and chronic Achilles tendinitis. Cold should be applied for 10 to 15 minutes every 2 to 3 hours for inflammation and pain and immediately after any activity that aggravates your symptoms. Use ice packs or an ice massage. Heat may be used before performing stretching  and  strengthening activities prescribed by your caregiver. Use a heat pack or a warm soak. SEEK MEDICAL CARE IF: Symptoms get worse or do not improve in 2 weeks despite treatment. New, unexplained symptoms develop. Drugs used in treatment may produce side effects.  EXERCISES:  RANGE OF MOTION (ROM) AND STRETCHING EXERCISES - Achilles Tendinitis  These exercises may help you when beginning to rehabilitate your injury. Your symptoms may resolve with or without further involvement from your physician, physical therapist or athletic trainer. While completing these exercises, remember:  Restoring tissue flexibility helps normal motion to return to the joints. This allows healthier, less painful movement and activity. An effective stretch should be held for at least 30 seconds. A stretch should never be painful. You should only feel a gentle lengthening or release in the stretched tissue.  STRETCH  Gastroc, Standing  Place hands on wall. Extend right / left leg, keeping the front knee somewhat bent. Slightly point your toes inward on your back foot. Keeping your right / left heel on the floor and your knee straight, shift your weight toward the wall, not allowing your back to arch. You should feel a gentle stretch in the right / left calf. Hold this position for 10 seconds. Repeat 3 times. Complete this stretch 2 times per day.  STRETCH  Soleus, Standing  Place hands on wall. Extend right / left leg, keeping the other knee somewhat bent. Slightly point your toes inward on your back foot. Keep your right / left heel on the floor, bend your back knee, and slightly shift your weight over the back leg so that you feel a gentle stretch deep in your back calf. Hold this position for 10 seconds. Repeat 3 times. Complete this stretch 2 times per day.  STRETCH  Gastrocsoleus, Standing  Note: This exercise can place a lot of stress on your foot and ankle. Please complete this exercise only if specifically  instructed by your caregiver.  Place the ball of your right / left foot on a step, keeping your other foot firmly on the same step. Hold on to the wall or a rail for balance. Slowly lift your other foot, allowing your body weight to press your heel down over the edge of the step. You should feel a stretch in your right / left calf. Hold this position for 10 seconds. Repeat this exercise with a slight bend in your knee. Repeat 3 times. Complete this stretch 2 times per day.   STRENGTHENING EXERCISES - Achilles Tendinitis These exercises may help you when beginning to rehabilitate your injury. They may resolve your symptoms with or without further involvement from your physician, physical therapist or athletic trainer. While completing these exercises, remember:  Muscles can gain both the endurance and the strength needed for everyday activities through controlled exercises. Complete these exercises as instructed by your physician, physical therapist or athletic trainer. Progress the resistance and repetitions only as guided. You may experience muscle soreness or fatigue, but the pain or discomfort you are trying to eliminate should never worsen during these exercises. If this pain does worsen, stop and make certain you are following the directions exactly. If the pain is still present after adjustments, discontinue the exercise until you can discuss the trouble with your clinician.  STRENGTH - Plantar-flexors  Sit with your right / left leg extended. Holding onto both ends of a rubber exercise band/tubing, loop it around the ball of your foot. Keep a slight tension in the band. Slowly  push your toes away from you, pointing them downward. Hold this position for 10 seconds. Return slowly, controlling the tension in the band/tubing. Repeat 3 times. Complete this exercise 2 times per day.   STRENGTH - Plantar-flexors  Stand with your feet shoulder width apart. Steady yourself with a wall or table  using as little support as needed. Keeping your weight evenly spread over the width of your feet, rise up on your toes.* Hold this position for 10 seconds. Repeat 3 times. Complete this exercise 2 times per day.  *If this is too easy, shift your weight toward your right / left leg until you feel challenged. Ultimately, you may be asked to do this exercise with your right / left foot only.  STRENGTH  Plantar-flexors, Eccentric  Note: This exercise can place a lot of stress on your foot and ankle. Please complete this exercise only if specifically instructed by your caregiver.  Place the balls of your feet on a step. With your hands, use only enough support from a wall or rail to keep your balance. Keep your knees straight and rise up on your toes. Slowly shift your weight entirely to your right / left toes and pick up your opposite foot. Gently and with controlled movement, lower your weight through your right / left foot so that your heel drops below the level of the step. You will feel a slight stretch in the back of your calf at the end position. Use the healthy leg to help rise up onto the balls of both feet, then lower weight only on the right / left leg again. Build up to 15 repetitions. Then progress to 3 consecutive sets of 15 repetitions.* After completing the above exercise, complete the same exercise with a slight knee bend (about 30 degrees). Again, build up to 15 repetitions. Then progress to 3 consecutive sets of 15 repetitions.* Perform this exercise 2 times per day.  *When you easily complete 3 sets of 15, your physician, physical therapist or athletic trainer may advise you to add resistance by wearing a backpack filled with additional weight.  STRENGTH - Plantar Flexors, Seated  Sit on a chair that allows your feet to rest flat on the ground. If necessary, sit at the edge of the chair. Keeping your toes firmly on the ground, lift your right / left heel as far as you can without  increasing any discomfort in your ankle. Repeat 3 times. Complete this exercise 2 times a day.  Look for an [employer] shoe attachment on [employer] or at [employer]. This will level out your hips while you are walking in the CAM boot. Wear this on the other foot around a supportive sneaker:    Look for heel lifts for achilles tendinitis from [employer] to use when wearing shoes.  Medications Complete the medrol  dosepak before starting the meloxicam.

## 2024-03-16 ENCOUNTER — Ambulatory Visit: Admitting: Sports Medicine

## 2024-03-24 ENCOUNTER — Ambulatory Visit: Admitting: Student in an Organized Health Care Education/Training Program

## 2024-03-24 ENCOUNTER — Encounter: Payer: Self-pay | Admitting: Physician Assistant

## 2024-03-24 ENCOUNTER — Encounter: Payer: Self-pay | Admitting: Student in an Organized Health Care Education/Training Program

## 2024-03-27 LAB — GENECONNECT MOLECULAR SCREEN: Genetic Analysis Overall Interpretation: NEGATIVE

## 2024-04-03 ENCOUNTER — Ambulatory Visit: Admitting: Podiatry

## 2024-04-03 ENCOUNTER — Encounter: Payer: Self-pay | Admitting: Podiatry

## 2024-04-03 DIAGNOSIS — M7661 Achilles tendinitis, right leg: Secondary | ICD-10-CM

## 2024-04-03 MED ORDER — METHYLPREDNISOLONE 4 MG PO TBPK: ORAL_TABLET | ORAL | 0 refills | Status: DC

## 2024-04-03 NOTE — Progress Notes (Signed)
  Subjective:  Patient ID: Kristina Calhoun, female    DOB: May 26, 1975,  MRN: [id number]  Chief Complaint  Patient presents with   Foot Pain    R foot pain 65% better prednisone  and boot definitely helped.    Discussed the use of AI scribe software for clinical note transcription with the patient, who gave verbal consent to proceed.  History of Present Illness Kristina Calhoun is a 48 year old female who presents with persistent pain in the Achilles area.  She describes persistent posterior Achilles pain near the heel bone, now about 65% improved, with less tenderness than before.  She can walk and uses heel wedges at home. She performs wall stretching and keeps the ankle mobile while working from home.  She takes meloxicam , which helps but causes drowsiness, so she avoids it when she must work or drive and uses Aleve  as needed. She requests another prednisone  course for further pain relief.  She denies diabetes or prediabetes.      Objective:    Physical Exam MUSCULOSKELETAL: Decreased swelling with mild tenderness at the right Achilles tendon insertion centrally and laterally. Normal muscle strength in both feet. Mild pain on dorsiflexion of the right foot. DP and PT pulses palpable 2/4 bilaterally.  Cap refill intact to the digits. Protective and light touch sensation intact. Pedal skin well-hydrated, normal skin texture and skin turgor.       Results    Assessment:   1. Achilles tendinitis of right lower extremity      Plan:  Patient was evaluated and treated and all questions answered.  Assessment and Plan Assessment & Plan Achilles tendinitis, right lower extremity Achilles tendinitis improving with 65% pain reduction. Slight tenderness near heel, some warmth present. Continued improvement expected. - Prescribed another Medrol  Dosepak. - Continue stretching exercises, including wall exercises and Equinus board. - Use boot more frequently during prolonged  activities. - Avoid going barefoot; wear supportive shoes. - Consider physical therapy if no significant improvement by next visit. - Use heel pads in boot; consider adjustable heel lifts for shoes. - Avoid concurrent use of ibuprofen or Aleve  with meloxicam .      Return in about 4 weeks (around 05/01/2024) for achilles tendonitis .

## 2024-04-03 NOTE — Patient Instructions (Signed)
Achilles Tendinitis  with Rehab Achilles tendinitis is a disorder of the Achilles tendon. The Achilles tendon connects the large calf muscles (Gastrocnemius and Soleus) to the heel bone (calcaneus). This tendon is sometimes called the heel cord. It is important for pushing-off and standing on your toes and is important for walking, running, or jumping. Tendinitis is often caused by overuse and repetitive microtrauma. SYMPTOMS Pain, tenderness, swelling, warmth, and redness may occur over the Achilles tendon even at rest. Pain with pushing off, or flexing or extending the ankle. Pain that is worsened after or during activity. CAUSES  Overuse sometimes seen with rapid increase in exercise programs or in sports requiring running and jumping. Poor physical conditioning (strength and flexibility or endurance). Running sports, especially training running down hills. Inadequate warm-up before practice or play or failure to stretch before participation. Injury to the tendon. PREVENTION  Warm up and stretch before practice or competition. Allow time for adequate rest and recovery between practices and competition. Keep up conditioning. Keep up ankle and leg flexibility. Improve or keep muscle strength and endurance. Improve cardiovascular fitness. Use proper technique. Use proper equipment (shoes, skates). To help prevent recurrence, taping, protective strapping, or an adhesive bandage may be recommended for several weeks after healing is complete. PROGNOSIS  Recovery may take weeks to several months to heal. Longer recovery is expected if symptoms have been prolonged. Recovery is usually quicker if the inflammation is due to a direct blow as compared with overuse or sudden strain. RELATED COMPLICATIONS  Healing time will be prolonged if the condition is not correctly treated. The injury must be given plenty of time to heal. Symptoms can reoccur if activity is resumed too soon. Untreated,  tendinitis may increase the risk of tendon rupture requiring additional time for recovery and possibly surgery. TREATMENT  The first treatment consists of rest anti-inflammatory medication, and ice to relieve the pain. Stretching and strengthening exercises after resolution of pain will likely help reduce the risk of recurrence. Referral to a physical therapist or athletic trainer for further evaluation and treatment may be helpful. A walking boot or cast may be recommended to rest the Achilles tendon. This can help break the cycle of inflammation and microtrauma. Arch supports (orthotics) may be prescribed or recommended by your caregiver as an adjunct to therapy and rest. Surgery to remove the inflamed tendon lining or degenerated tendon tissue is rarely necessary and has shown less than predictable results. MEDICATION  Nonsteroidal anti-inflammatory medications, such as aspirin and ibuprofen, may be used for pain and inflammation relief. Do not take within 7 days before surgery. Take these as directed by your caregiver. Contact your caregiver immediately if any bleeding, stomach upset, or signs of allergic reaction occur. Other minor pain relievers, such as acetaminophen, may also be used. Pain relievers may be prescribed as necessary by your caregiver. Do not take prescription pain medication for longer than 4 to 7 days. Use only as directed and only as much as you need. Cortisone injections are rarely indicated. Cortisone injections may weaken tendons and predispose to rupture. It is better to give the condition more time to heal than to use them. HEAT AND COLD Cold is used to relieve pain and reduce inflammation for acute and chronic Achilles tendinitis. Cold should be applied for 10 to 15 minutes every 2 to 3 hours for inflammation and pain and immediately after any activity that aggravates your symptoms. Use ice packs or an ice massage. Heat may be used before performing stretching  and  strengthening activities prescribed by your caregiver. Use a heat pack or a warm soak. SEEK MEDICAL CARE IF: Symptoms get worse or do not improve in 2 weeks despite treatment. New, unexplained symptoms develop. Drugs used in treatment may produce side effects.  EXERCISES:  RANGE OF MOTION (ROM) AND STRETCHING EXERCISES - Achilles Tendinitis  These exercises may help you when beginning to rehabilitate your injury. Your symptoms may resolve with or without further involvement from your physician, physical therapist or athletic trainer. While completing these exercises, remember:  Restoring tissue flexibility helps normal motion to return to the joints. This allows healthier, less painful movement and activity. An effective stretch should be held for at least 30 seconds. A stretch should never be painful. You should only feel a gentle lengthening or release in the stretched tissue.  STRETCH  Gastroc, Standing  Place hands on wall. Extend right / left leg, keeping the front knee somewhat bent. Slightly point your toes inward on your back foot. Keeping your right / left heel on the floor and your knee straight, shift your weight toward the wall, not allowing your back to arch. You should feel a gentle stretch in the right / left calf. Hold this position for 10 seconds. Repeat 3 times. Complete this stretch 2 times per day.  STRETCH  Soleus, Standing  Place hands on wall. Extend right / left leg, keeping the other knee somewhat bent. Slightly point your toes inward on your back foot. Keep your right / left heel on the floor, bend your back knee, and slightly shift your weight over the back leg so that you feel a gentle stretch deep in your back calf. Hold this position for 10 seconds. Repeat 3 times. Complete this stretch 2 times per day.  STRETCH  Gastrocsoleus, Standing  Note: This exercise can place a lot of stress on your foot and ankle. Please complete this exercise only if specifically  instructed by your caregiver.  Place the ball of your right / left foot on a step, keeping your other foot firmly on the same step. Hold on to the wall or a rail for balance. Slowly lift your other foot, allowing your body weight to press your heel down over the edge of the step. You should feel a stretch in your right / left calf. Hold this position for 10 seconds. Repeat this exercise with a slight bend in your knee. Repeat 3 times. Complete this stretch 2 times per day.   STRENGTHENING EXERCISES - Achilles Tendinitis These exercises may help you when beginning to rehabilitate your injury. They may resolve your symptoms with or without further involvement from your physician, physical therapist or athletic trainer. While completing these exercises, remember:  Muscles can gain both the endurance and the strength needed for everyday activities through controlled exercises. Complete these exercises as instructed by your physician, physical therapist or athletic trainer. Progress the resistance and repetitions only as guided. You may experience muscle soreness or fatigue, but the pain or discomfort you are trying to eliminate should never worsen during these exercises. If this pain does worsen, stop and make certain you are following the directions exactly. If the pain is still present after adjustments, discontinue the exercise until you can discuss the trouble with your clinician.  STRENGTH - Plantar-flexors  Sit with your right / left leg extended. Holding onto both ends of a rubber exercise band/tubing, loop it around the ball of your foot. Keep a slight tension in the band. Slowly  push your toes away from you, pointing them downward. Hold this position for 10 seconds. Return slowly, controlling the tension in the band/tubing. Repeat 3 times. Complete this exercise 2 times per day.   STRENGTH - Plantar-flexors  Stand with your feet shoulder width apart. Steady yourself with a wall or table  using as little support as needed. Keeping your weight evenly spread over the width of your feet, rise up on your toes.* Hold this position for 10 seconds. Repeat 3 times. Complete this exercise 2 times per day.  *If this is too easy, shift your weight toward your right / left leg until you feel challenged. Ultimately, you may be asked to do this exercise with your right / left foot only.  STRENGTH  Plantar-flexors, Eccentric  Note: This exercise can place a lot of stress on your foot and ankle. Please complete this exercise only if specifically instructed by your caregiver.  Place the balls of your feet on a step. With your hands, use only enough support from a wall or rail to keep your balance. Keep your knees straight and rise up on your toes. Slowly shift your weight entirely to your right / left toes and pick up your opposite foot. Gently and with controlled movement, lower your weight through your right / left foot so that your heel drops below the level of the step. You will feel a slight stretch in the back of your calf at the end position. Use the healthy leg to help rise up onto the balls of both feet, then lower weight only on the right / left leg again. Build up to 15 repetitions. Then progress to 3 consecutive sets of 15 repetitions.* After completing the above exercise, complete the same exercise with a slight knee bend (about 30 degrees). Again, build up to 15 repetitions. Then progress to 3 consecutive sets of 15 repetitions.* Perform this exercise 2 times per day.  *When you easily complete 3 sets of 15, your physician, physical therapist or athletic trainer may advise you to add resistance by wearing a backpack filled with additional weight.  STRENGTH - Plantar Flexors, Seated  Sit on a chair that allows your feet to rest flat on the ground. If necessary, sit at the edge of the chair. Keeping your toes firmly on the ground, lift your right / left heel as far as you can without  increasing any discomfort in your ankle. Repeat 3 times. Complete this exercise 2 times a day.   Look for an "EvenUp" shoe attachment on [employer] or at [employer]. This will level out your hips while you are walking in the CAM boot. Wear this on the other foot around a supportive sneaker:

## 2024-04-06 ENCOUNTER — Other Ambulatory Visit: Payer: Self-pay | Admitting: Podiatry

## 2024-04-06 DIAGNOSIS — M7661 Achilles tendinitis, right leg: Secondary | ICD-10-CM

## 2024-05-01 ENCOUNTER — Ambulatory Visit: Admitting: Podiatry

## 2024-05-13 ENCOUNTER — Other Ambulatory Visit: Payer: Self-pay | Admitting: Podiatry

## 2024-05-13 DIAGNOSIS — M7661 Achilles tendinitis, right leg: Secondary | ICD-10-CM

## 2024-05-14 ENCOUNTER — Encounter: Payer: Self-pay | Admitting: Podiatry

## 2024-05-14 ENCOUNTER — Ambulatory Visit: Admitting: Podiatry

## 2024-05-14 DIAGNOSIS — M7751 Other enthesopathy of right foot: Secondary | ICD-10-CM

## 2024-05-14 DIAGNOSIS — M7661 Achilles tendinitis, right leg: Secondary | ICD-10-CM

## 2024-05-14 NOTE — Progress Notes (Signed)
"  °  Subjective:  Patient ID: Kristina Calhoun, female    DOB: 02-Nov-1975,  MRN: [id number]  Chief Complaint  Patient presents with   Foot Pain    Pt feels pain has improved 70 %.  Finished Medrol  Dosepak. Has started with nutritionist. Has been using stretching exercises, Using boot around the house more. Wearing supportive shoes.      Discussed the use of AI scribe software for clinical note transcription with the patient, who gave verbal consent to proceed.  History of Present Illness Kristina Calhoun is a 49 year old female with right ankle bursitis and Achilles tendinitis who presents for follow-up of persistent right lateral ankle tenderness and swelling.  Three weeks ago her dog struck her right ankle while she was descending stairs, causing a popping sensation and swelling at the posterior lateral ankle proximal to the peroneal tendons. She notes mild focal tenderness without significant pain or radiation.  She completed a prednisone  course. She uses meloxicam  intermittently but prefers Aleve  as needed due to sedation with meloxicam . Stretching, rest, supportive sneakers, and a compressive ankle brace improve symptoms, especially after a couple of days of rest.  She denies current pain or tenderness over the Achilles tendon or posterior heel and can twist and turn the ankle without discomfort. She continues Achilles stretching and feels her Achilles symptoms have largely resolved. She has had no new ankle injuries since the initial event.      Objective:    Physical Exam MUSCULOSKELETAL: Swelling on the right posterior lateral ankle. No tenderness over the Achilles or back of the heel.  Mild tenderness on palpation posterior to the lateral malleolus.  No tenderness on palpation of the peroneal tendons inferior to the ankle or distally. Neurovascular status unchanged Pedal skin well-hydrated normal limits for skin texture and skin turgor Light touch and epicritic sensation intact  bilaterally   No images are attached to the encounter.    Results    Assessment:   1. Achilles tendinitis of right lower extremity   2. Bursitis of right ankle      Plan:  Patient was evaluated and treated and all questions answered.  Assessment and Plan Assessment & Plan Right ankle bursitis Mild right posterior lateral ankle bursitis post minor trauma with improving symptoms. No significant sprain or major injury. Expected to resolve with conservative management. Steroid injection considered if pain worsens, with caution due to Achilles tendon proximity. - Discontinued meloxicam . - Advised naproxen  as needed. - Recommended regular cryotherapy with ice. - Advised compressive anklet brace for several weeks. - Recommended supportive footwear. - Provided ankle rehabilitation exercises. - Instructed follow-up if symptoms worsen or persist over 3-4 weeks. - Discussed potential corticosteroid injection if pain uncontrolled, with caution near Achilles tendon.  Achilles tendinitis of right lower extremity Achilles tendinitis resolved and asymptomatic. - Advised continued Achilles stretching exercises. - Recommended follow-up if symptoms recur.      Return if symptoms worsen or fail to improve, for ankle bursitis.    "

## 2024-05-20 ENCOUNTER — Other Ambulatory Visit: Payer: Self-pay

## 2024-05-20 DIAGNOSIS — Z1231 Encounter for screening mammogram for malignant neoplasm of breast: Secondary | ICD-10-CM

## 2024-05-29 ENCOUNTER — Ambulatory Visit: Admission: RE | Admit: 2024-05-29 | Discharge: 2024-05-29 | Disposition: A | Source: Ambulatory Visit

## 2024-05-29 DIAGNOSIS — Z1231 Encounter for screening mammogram for malignant neoplasm of breast: Secondary | ICD-10-CM

## 2024-12-10 ENCOUNTER — Ambulatory Visit: Admitting: Family Medicine
